# Patient Record
Sex: Female | Born: 1995 | Race: Black or African American | Hispanic: No | Smoking: Never smoker
Health system: Southern US, Community
[De-identification: ages and names within clinical notes are randomized; demographics above are authoritative.]

## PROBLEM LIST (undated history)

## (undated) DIAGNOSIS — L309 Dermatitis, unspecified: Secondary | ICD-10-CM

## (undated) DIAGNOSIS — D649 Anemia, unspecified: Secondary | ICD-10-CM

## (undated) DIAGNOSIS — Z9109 Other allergy status, other than to drugs and biological substances: Secondary | ICD-10-CM

## (undated) DIAGNOSIS — F419 Anxiety disorder, unspecified: Secondary | ICD-10-CM

## (undated) HISTORY — DX: Anemia, unspecified: D64.9

## (undated) HISTORY — DX: Other allergy status, other than to drugs and biological substances: Z91.09

## (undated) HISTORY — PX: WISDOM TOOTH EXTRACTION: SHX21

## (undated) HISTORY — DX: Anxiety disorder, unspecified: F41.9

## (undated) HISTORY — DX: Dermatitis, unspecified: L30.9

---
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
## 2018-02-04 ENCOUNTER — Ambulatory Visit: Payer: Self-pay | Admitting: Family Medicine

## 2018-02-04 ENCOUNTER — Encounter: Payer: Self-pay | Admitting: Family Medicine

## 2018-02-04 DIAGNOSIS — Z021 Encounter for pre-employment examination: Secondary | ICD-10-CM

## 2018-02-04 DIAGNOSIS — Z111 Encounter for screening for respiratory tuberculosis: Secondary | ICD-10-CM

## 2018-02-04 NOTE — Patient Instructions (Signed)

## 2018-02-04 NOTE — Progress Notes (Signed)
Karen May is a 22 y.o. 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 She denies any chronic or acute health conditions. She also needs TB test placement.  Review of Systems  Constitutional: Negative for chills, fever and malaise/fatigue.  HENT: Negative for congestion, ear discharge, ear pain, sinus pain and sore throat.   Eyes: Negative.   Respiratory: Negative for cough, sputum production and shortness of breath.   Cardiovascular: Negative.  Negative for chest pain.  Gastrointestinal: Negative for abdominal pain, diarrhea, nausea and vomiting.  Genitourinary: Negative for dysuria, frequency, hematuria and urgency.  Musculoskeletal: Negative for myalgias.  Skin: Negative.   Neurological: Negative for headaches.  Endo/Heme/Allergies: Negative.   Psychiatric/Behavioral: Negative.     O: Vitals:   02/04/18 1603  BP: 92/62  Pulse: 94  Temp: 98.9 F (37.2 C)  SpO2: 98%     Physical Exam  Constitutional: She is oriented to person, place, and time. Vital signs are normal. She appears well-developed and well-nourished. She is active.  Non-toxic appearance. She does not have a sickly appearance.  HENT:  Head: Normocephalic.  Right Ear: Hearing, tympanic membrane, external ear and ear canal normal.  Left Ear: Hearing, tympanic membrane, external ear and ear canal normal.  Nose: Nose normal.  Mouth/Throat: Uvula is midline and oropharynx is clear and moist.  Neck: Normal range of motion. Neck supple.  Cardiovascular: Normal rate, regular rhythm, normal heart sounds and normal pulses.  Pulmonary/Chest: Effort normal and breath sounds normal.  Abdominal: Soft. Bowel sounds are normal.  Musculoskeletal: Normal range of motion.  Lymphadenopathy:       Head (right side): No submental and no submandibular adenopathy present.       Head (left side): No submental and no submandibular adenopathy present.    She has no cervical  adenopathy.  Neurological: She is alert and oriented to person, place, and time.  Psychiatric: She has a normal mood and affect.  PHQ-9  Vitals reviewed.  A: 1. Screening examination for pulmonary tuberculosis   2. Physical exam, pre-employment     P: Discussed exam findings, diagnosis etiology and medication use and indications reviewed with patient. Follow- Up and discharge instructions provided. No emergent/urgent issues found on exam.  Patient verbalized understanding of information provided and agrees with plan of care (POC), all questions answered.  1. Physical exam, pre-employment WNL- no acute findings on interview or exam 2. Screening examination for pulmonary tuberculosis - PPD- placed 02/04/2018

## 2018-02-06 LAB — TB SKIN TEST
INDURATION: 0 mm
TB Skin Test: NEGATIVE

## 2018-04-10 ENCOUNTER — Other Ambulatory Visit: Payer: Self-pay

## 2018-04-10 ENCOUNTER — Encounter (HOSPITAL_BASED_OUTPATIENT_CLINIC_OR_DEPARTMENT_OTHER): Payer: Self-pay

## 2018-04-10 ENCOUNTER — Emergency Department (HOSPITAL_BASED_OUTPATIENT_CLINIC_OR_DEPARTMENT_OTHER)
Admission: EM | Admit: 2018-04-10 | Discharge: 2018-04-10 | Disposition: A | Discharge status: Home / Self Care | Payer: 59 | Attending: Emergency Medicine | Admitting: Emergency Medicine

## 2018-04-10 DIAGNOSIS — F17210 Nicotine dependence, cigarettes, uncomplicated: Secondary | ICD-10-CM

## 2018-04-10 DIAGNOSIS — N946 Dysmenorrhea, unspecified: Secondary | ICD-10-CM

## 2018-04-10 DIAGNOSIS — N3000 Acute cystitis without hematuria: Principal | ICD-10-CM

## 2018-04-10 DIAGNOSIS — R1084 Generalized abdominal pain: Secondary | ICD-10-CM | POA: Diagnosis present

## 2018-04-10 LAB — CBC WITH DIFFERENTIAL/PLATELET
ABS IMMATURE GRANULOCYTES: 0.01 10*3/uL (ref 0.00–0.07)
Basophils Absolute: 0 10*3/uL (ref 0.0–0.1)
Basophils Relative: 1 %
Eosinophils Absolute: 0.1 10*3/uL (ref 0.0–0.5)
Eosinophils Relative: 1 %
HCT: 33.8 % — ABNORMAL LOW (ref 36.0–46.0)
HEMOGLOBIN: 9.9 g/dL — AB (ref 12.0–15.0)
IMMATURE GRANULOCYTES: 0 %
LYMPHS PCT: 27 %
Lymphs Abs: 1.7 10*3/uL (ref 0.7–4.0)
MCH: 22.7 pg — AB (ref 26.0–34.0)
MCHC: 29.3 g/dL — ABNORMAL LOW (ref 30.0–36.0)
MCV: 77.5 fL — AB (ref 80.0–100.0)
MONO ABS: 0.7 10*3/uL (ref 0.1–1.0)
MONOS PCT: 11 %
NEUTROS ABS: 3.9 10*3/uL (ref 1.7–7.7)
NEUTROS PCT: 60 %
PLATELETS: 250 10*3/uL (ref 150–400)
RBC: 4.36 MIL/uL (ref 3.87–5.11)
RDW: 16.9 % — ABNORMAL HIGH (ref 11.5–15.5)
WBC: 6.5 10*3/uL (ref 4.0–10.5)
nRBC: 0 % (ref 0.0–0.2)

## 2018-04-10 LAB — COMPREHENSIVE METABOLIC PANEL
ALK PHOS: 37 U/L — AB (ref 38–126)
ALT: 9 U/L (ref 0–44)
AST: 14 U/L — AB (ref 15–41)
Albumin: 3.6 g/dL (ref 3.5–5.0)
Anion gap: 10 (ref 5–15)
BUN: 10 mg/dL (ref 6–20)
CALCIUM: 8.4 mg/dL — AB (ref 8.9–10.3)
CHLORIDE: 106 mmol/L (ref 98–111)
CO2: 23 mmol/L (ref 22–32)
CREATININE: 0.74 mg/dL (ref 0.44–1.00)
Glucose, Bld: 80 mg/dL (ref 70–99)
Potassium: 3.4 mmol/L — ABNORMAL LOW (ref 3.5–5.1)
SODIUM: 139 mmol/L (ref 135–145)
Total Bilirubin: 1.1 mg/dL (ref 0.3–1.2)
Total Protein: 6.7 g/dL (ref 6.5–8.1)

## 2018-04-10 LAB — URINALYSIS, MICROSCOPIC (REFLEX)

## 2018-04-10 LAB — URINALYSIS, ROUTINE W REFLEX MICROSCOPIC
BILIRUBIN URINE: NEGATIVE
GLUCOSE, UA: NEGATIVE mg/dL
HGB URINE DIPSTICK: NEGATIVE
Ketones, ur: 15 mg/dL — AB
Nitrite: NEGATIVE
Protein, ur: NEGATIVE mg/dL
SPECIFIC GRAVITY, URINE: 1.02 (ref 1.005–1.030)
pH: 6.5 (ref 5.0–8.0)

## 2018-04-10 LAB — PREGNANCY, URINE: Preg Test, Ur: NEGATIVE

## 2018-04-10 LAB — LIPASE, BLOOD: Lipase: 22 U/L (ref 11–51)

## 2018-04-10 MED ORDER — NITROFURANTOIN MONOHYD MACRO 100 MG PO CAPS: 100.0000 mg | ORAL_CAPSULE | Freq: Two times a day (BID) | ORAL | 0 refills | Status: DC

## 2018-04-10 NOTE — ED Notes (Signed)
Pt up to BR to obtain urine specimen

## 2018-04-10 NOTE — ED Provider Notes (Signed)
[facility] EMERGENCY DEPARTMENT Provider Note   CSN: [id number] Arrival date & time: 04/10/18  1143   History   Chief Complaint Chief Complaint  Patient presents with  . Dysmenorrhea    HPI Karen May is a 21 y.o. female with no significant history who presents for evaluation of abdominal pain.  She woke this morning with generalized abdominal pain, worse in the upper quadrants.  Rates her pain 2/10 currently, 9/10 at its worse.  Describes her pain as stabbing and cramping.  Denies history of abdominal surgeries.  Last menstrual period 04/09/18.  Denies fever, chills, nausea, vomiting, diarrhea, constipation, dysuria, vaginal discharge.  Patient states she does have a history of dysmenorrhea.  States she normally takes ibuprofen which resolves her symptoms.  Patient states that when she first took ibuprofen this morning it "did not work."  Patient states her pain does not get worse with food intake.  Denies aggravating or alleviating factors.  Denies sick contacts.  History obtained from patient.  No interpreter was used.  HPI  History reviewed. No pertinent past medical history.  There are no active problems to display for this patient.   History reviewed. No pertinent surgical history.   OB History   None      Home Medications    Prior to Admission medications   Medication Sig Start Date End Date Taking? Authorizing Provider  nitrofurantoin, macrocrystal-monohydrate, (MACROBID) 100 MG capsule Take 1 capsule (100 mg total) by mouth 2 (two) times daily. 04/10/18   ,  A, PA-C    Family History No family history on file.  Social History Social History   Tobacco Use  . Smoking status: Current Every Day Smoker    Types: Cigarettes  . Smokeless tobacco: Never Used  Substance Use Topics  . Alcohol use: Yes    Comment: occ  . Drug use: Yes    Types: Marijuana     Allergies   Patient has no known allergies.   Review of Systems Review  of Systems  Constitutional: Negative.   Respiratory: Negative.   Cardiovascular: Negative.   Gastrointestinal: Positive for abdominal pain. Negative for abdominal distention, anal bleeding, blood in stool, constipation, diarrhea, nausea, rectal pain and vomiting.  Genitourinary: Negative.   Musculoskeletal: Negative.   Neurological: Negative.   All other systems reviewed and are negative.    Physical Exam Updated Vital Signs BP 117/66 (BP Location: Right Arm)   Pulse (!) 59   Temp 98.3 F (36.8 C) (Oral)   Resp 16   Ht 5\' 5"  (1.651 m)   Wt 63.5 kg   LMP 04/09/2018   SpO2 100%   BMI 23.30 kg/m   Physical Exam  Constitutional: Vital signs are normal. She appears well-developed and well-nourished.  Non-toxic appearance. She does not have a sickly appearance. She does not appear ill. No distress.  HENT:  Head: Atraumatic.  Mouth/Throat: Oropharynx is clear and moist.  Eyes: Pupils are equal, round, and reactive to light.  Neck: Normal range of motion.  Cardiovascular: Normal rate, regular rhythm, normal heart sounds and intact distal pulses. Exam reveals no friction rub.  No murmur heard. Pulmonary/Chest: Effort normal and breath sounds normal. No stridor. No respiratory distress. She has no wheezes. She has no rales. She exhibits no tenderness.  Abdominal: Soft. Normal appearance and bowel sounds are normal. She exhibits no distension, no pulsatile liver, no fluid wave, no ascites, no pulsatile midline mass and no mass. There is no hepatosplenomegaly. There is no tenderness.  There is no rigidity, no rebound, no guarding, no CVA tenderness and negative Murphy's sign.  Musculoskeletal: Normal range of motion.  Neurological: She is alert.  Skin: Skin is warm and dry. She is not diaphoretic.  Psychiatric: She has a normal mood and affect.  Nursing note and vitals reviewed.    ED Treatments / Results  Labs (all labs ordered are listed, but only abnormal results are  displayed) Labs Reviewed  CBC WITH DIFFERENTIAL/PLATELET - Abnormal; Notable for the following components:      Result Value   Hemoglobin 9.9 (*)    HCT 33.8 (*)    MCV 77.5 (*)    MCH 22.7 (*)    MCHC 29.3 (*)    RDW 16.9 (*)    All other components within normal limits  COMPREHENSIVE METABOLIC PANEL - Abnormal; Notable for the following components:   Potassium 3.4 (*)    Calcium 8.4 (*)    AST 14 (*)    Alkaline Phosphatase 37 (*)    All other components within normal limits  URINALYSIS, ROUTINE W REFLEX MICROSCOPIC - Abnormal; Notable for the following components:   Ketones, ur 15 (*)    Leukocytes, UA MODERATE (*)    All other components within normal limits  URINALYSIS, MICROSCOPIC (REFLEX) - Abnormal; Notable for the following components:   Bacteria, UA MANY (*)    All other components within normal limits  LIPASE, BLOOD  PREGNANCY, URINE    EKG None  Radiology No results found.  Procedures Procedures (including critical care time)  Medications Ordered in ED Medications - No data to display   Initial Impression / Assessment and Plan / ED Course  I have reviewed the triage vital signs and the nursing notes.  Pertinent labs & imaging results that were available during my care of the patient were reviewed by me and considered in my medical decision making (see chart for details).  22 year old who appears otherwise well presents for evaluation of generalized abdominal pain, worse in upper quadrants.  Afebrile, nonseptic, non-ill-appearing.  Abdomen without tenderness, rebound or guarding on exam.  No abdominal pain on initial exam.  Negative Murphy sign.  Will obtain labs, urine and reevaluate.  Labs without leukocytosis, mild hypokalemia 3.4, hemoglobin 9.9, patient states she has chronic anemia and is supposed to be taking iron, however is not taking this.  Lipase 22, urine pregnancy negative, urinalysis consistent with infection.  Patient without abdominal  tenderness on reevaluation.  No rebound or guarding.  I do not feel patient needs additional imaging at this time as she is currently asymptomatic.  Will DC home with antibiotics for UTI.  Patient is nontoxic, nonseptic appearing, in no apparent distress. Patient does not meet the SIRS or Sepsis criteria.  On repeat exam patient does not have a surgical abdomin and there are no peritoneal signs.  No indication of appendicitis, bowel obstruction, bowel perforation, cholecystitis, diverticulitis, PID or ectopic pregnancy.  Patient discharged home with symptomatic treatment and given strict instructions for follow-up with their primary care physician.  I have also discussed reasons to return immediately to the ER.  Patient expresses understanding and agrees with plan.    Final Clinical Impressions(s) / ED Diagnoses   Final diagnoses:  Acute cystitis without hematuria    ED Discharge Orders         Ordered    nitrofurantoin, macrocrystal-monohydrate, (MACROBID) 100 MG capsule  2 times daily     04/10/18 1528  Linwood DibblesHenderly,  A, PA-C 04/10/18 1534    Tilden Fossaees, Elizabeth, MD 04/11/18 [phone]

## 2018-04-10 NOTE — ED Triage Notes (Addendum)
C/o painful menstrual cramps-LMP 11/12-NAD-steady gait-took motrin PTA with some relief

## 2018-04-10 NOTE — Discharge Instructions (Addendum)
Evaluated today for abdominal pain. You urinalysis showed you have a UTI.  I have prescribed you antibiotics.  Please take as prescribed.  Please follow-up with your PCP for reevaluation.  Return to the ED for any worsening symptoms

## 2018-04-10 NOTE — ED Notes (Signed)
NAD at this time. Pt is stable and going home.  

## 2018-09-25 ENCOUNTER — Other Ambulatory Visit: Payer: Self-pay

## 2018-09-25 ENCOUNTER — Other Ambulatory Visit (HOSPITAL_COMMUNITY)
Admission: RE | Admit: 2018-09-25 | Discharge: 2018-09-25 | Disposition: A | Discharge status: Home / Self Care | Payer: 59 | Source: Ambulatory Visit | Attending: Certified Nurse Midwife | Admitting: Certified Nurse Midwife

## 2018-09-25 ENCOUNTER — Ambulatory Visit (INDEPENDENT_AMBULATORY_CARE_PROVIDER_SITE_OTHER): Payer: 59 | Admitting: Certified Nurse Midwife

## 2018-09-25 ENCOUNTER — Encounter: Payer: Self-pay | Admitting: Certified Nurse Midwife

## 2018-09-25 VITALS — BP 90/50 | Ht 65.0 in | Wt 146.0 lb

## 2018-09-25 DIAGNOSIS — Z3400 Encounter for supervision of normal first pregnancy, unspecified trimester: Principal | ICD-10-CM

## 2018-09-25 DIAGNOSIS — Z1379 Encounter for other screening for genetic and chromosomal anomalies: Secondary | ICD-10-CM

## 2018-09-25 DIAGNOSIS — O99321 Drug use complicating pregnancy, first trimester: Secondary | ICD-10-CM

## 2018-09-25 DIAGNOSIS — Z3A01 Less than 8 weeks gestation of pregnancy: Secondary | ICD-10-CM

## 2018-09-25 DIAGNOSIS — Z113 Encounter for screening for infections with a predominantly sexual mode of transmission: Secondary | ICD-10-CM

## 2018-09-25 DIAGNOSIS — F1290 Cannabis use, unspecified, uncomplicated: Secondary | ICD-10-CM

## 2018-09-25 DIAGNOSIS — Z124 Encounter for screening for malignant neoplasm of cervix: Secondary | ICD-10-CM

## 2018-09-25 NOTE — Progress Notes (Signed)
New Obstetric Patient H&P    Chief Complaint: "Desires prenatal care"   History of Present Illness: Patient is a 23 y.o. G1P0 Black female, LMP 08/08/2018 presents with amenorrhea and positive home pregnancy test. Based on her  LMP, her EDD is Estimated Date of Delivery: 05/15/19 and her EGA is [redacted]w[redacted]d. Cycles are 5-7. days, regular, and occur approximately every : 28 days. Her last pap smear was 1 or 2 years ago and was no abnormalities.    She had a urine pregnancy test which was positive 9 day(s)  Ago (09/16/2018). Her last menstrual period was normal and lasted for  5-7 day(s). Since her LMP she claims she has experienced nausea and vomiting and breast tenderness. She denies vaginal bleeding, abdominal pain, dysuria. Her past medical history is remarkable for environmental allergies and eczema.  Since her LMP, she admits to the use of tobacco products  No Since her LMP she admits to use of alcohol until +UPT (4/20) Since her LMP, she admits to the use of marijuana (1-2/week) She claims she has gained  8 pounds since the start of her pregnancy.  There are cats in the home in the home  no If yes NA She admits close contact with children on a regular basis  no  She has had chicken pox in the past no She has had Tuberculosis exposures, symptoms, or previously tested positive for TB   no Current or past history of domestic violence. no  Genetic Screening/Teratology Counseling: (Includes patient, baby's father, or anyone in either family with:)   1. Patient's age >/= 61 [facility]  no 2. Thalassemia ([place], [place], Mediterranean, or Asian background): MCV<80  no 3. Neural tube defect (meningomyelocele, spina bifida, anencephaly)  no 4. Congenital heart defect  no  5. Down syndrome  no 6. Tay-Sachs (Jewish, [place]  no 7. Canavan's Disease  no 8. Sickle cell disease or trait (African)  no  9. Hemophilia or other blood disorders  no  10. Muscular dystrophy  no  11. Cystic  fibrosis  no  12. Huntington's Chorea  no  13. Mental retardation/autism  no 14. Other inherited genetic or chromosomal disorder  no 15. Maternal metabolic disorder (DM, PKU, etc)  no 16. Patient or FOB with a child with a birth defect not listed above no  16a. Patient or FOB with a birth defect themselves no 17. Recurrent pregnancy loss, or stillbirth  no  18. Any medications since LMP other than prenatal vitamins (include vitamins, supplements, OTC meds, drugs, alcohol)  Yes. Marijuana, alcohol until +UPT 19. Any other genetic/environmental exposure to discuss  no  Infection History:   1. Lives with someone with TB or TB exposed  no  2. Patient or partner has history of genital herpes  no 3. Rash or viral illness since LMP  no 4. History of STI (GC, CT, HPV, syphilis, HIV)  no 5. History of recent travel :  no  Other pertinent information:  Yes, FOB/partner is [place] age 31, Engineer, maintenance (IT), and will be involved with her care    Review of Systems:10 point review of systems negative unless otherwise noted in HPI  Past Medical History:  Past Medical History:  Diagnosis Date  . Anemia   . Eczema 2010  . Environmental allergies     Past Surgical History:  Past Surgical History:  Procedure Laterality Date  . WISDOM TOOTH EXTRACTION      Gynecologic History: Patient's last  menstrual period was 08/08/2018 (exact date).  Obstetric History: G1P0  Family History:  Family History  Problem Relation Age of Onset  . Lung cancer Maternal Grandmother   . Diabetes Paternal Grandmother   . Breast cancer Maternal Aunt 61       maternal great aunt  . Cancer Cousin   . Breast cancer Cousin 27  . Schizophrenia Maternal Aunt   . Diabetes Cousin     Social History:  Social History   Socioeconomic History  . Marital status: Significant Other    Spouse name: 9  . Number of children: 0  . Years of education: 16  . Highest education level: Bachelor's degree (e.g., BA,  AB, BS)  Occupational History  . Occupation: Rehab specialist  Social Needs  . Financial resource strain: Not on file  . Food insecurity:    Worry: Not on file    Inability: Not on file  . Transportation needs:    Medical: Not on file    Non-medical: Not on file  Tobacco Use  . Smoking status: Never Smoker  . Smokeless tobacco: Never Used  Substance and Sexual Activity  . Alcohol use: Not Currently    Comment: occ  . Drug use: Yes    Types: Marijuana  . Sexual activity: Yes    Partners: Male    Birth control/protection: None  Lifestyle  . Physical activity:    Days per week: Not on file    Minutes per session: Not on file  . Stress: Not on file  Relationships  . Social connections:    Talks on phone: Not on file    Gets together: Not on file    Attends religious service: Not on file    Active member of club or organization: Not on file    Attends meetings of clubs or organizations: Not on file    Relationship status: Not on file  . Intimate partner violence:    Fear of current or ex partner: Not on file    Emotionally abused: Not on file    Physically abused: Not on file    Forced sexual activity: Not on file  Other Topics Concern  . Not on file  Social History Narrative  . Not on file    Allergies:  No Known Allergies  Medications: none Physical Exam Vitals: BP (!) 90/50   Wt 146 lb (66.2 kg)   LMP 08/08/2018 (Exact Date)   BMI 24.30 kg/m   General: BF in NAD HEENT: normocephalic, anicteric, PEARL  Mouth: normal dentition Thyroid: no enlargement, no palpable nodules Breasts: No masses, inflammation, soft, tender. Pierced nipples Pulmonary: No increased work of breathing, CTAB Cardiovascular: RRR, without murmur Abdomen: soft, non-tender, non-distended.  Umbilicus without lesions.  No hepatomegaly or masses palpable. No evidence of hernia. Pierced umbilicus Genitourinary:  External: Normal external female genitalia.  Normal urethral meatus, normal  Bartholin's and Skene's glands.    Vagina: Normal vaginal mucosa, no evidence of prolapse.    Cervix: Grossly normal in appearance, no bleeding  Uterus: AF, globular, 6-8 week size, mobile, normal contour.    Adnexa: ovaries non-enlarged, no adnexal masses  Rectal: deferred Extremities: no edema, erythema, or tenderness Neurologic: Grossly intact Psychiatric: mood appropriate, affect full   Assessment: 23 y.o. G1P0 at [redacted]w[redacted]d presenting to initiate prenatal care S=D Marijuana use in early pregnancy/ Alcohol use in first 2 weeks of pregnancy    Plan: 1) Avoid alcoholic beverages. 2) Patient encouraged not to smoke marijuana. Recommend trying Unisom  and vitamin B6 for nausea of pregnancy. If that is not effective, patient to contact me for prescription for antiemetic. Also encouraged to eat small frequent meals/snacks.    3) Take prenatal vitamins daily. GIven samples to try. 4) Nutrition, food safety (fish, cheese advisories, and high nitrite foods) and exercise discussed. Handouts given 5) Hospital and practice style discussed with cross coverage system.  6) Genetic Screening, such as with 1st Trimester Screening, cell free fetal DNA, AFP testing, and Ultrasound, as well as with amniocentesis, is discussed with patient. At the conclusion of today's visit patient requested genetic testing. Will schedule for first trimester test in 5-6 weeks. ROB in 3-4 weeks for DT check 7) Patient is asked about travel to areas at risk for the Zika virus, and counseled to avoid travel and exposure to mosquitoes or sexual partners who may have themselves been exposed to the virus. Testing is discussed, and will be ordered as appropriate.  Recommend taking precautions to decrease risk of Covid 19 infection (mask, wash hands frequently, etc) 8) NOB labs today, Paptima done 9) Is interested in breast feeding. Recommend removing nipple piercings.  Farrel Connersolleen Karell Tukes, CNM    NOB- no concerns

## 2018-09-25 NOTE — Progress Notes (Signed)
NOB H&P/ NOB labs Desires first trimester test

## 2018-09-25 NOTE — Patient Instructions (Signed)
 Hello,  Given the current COVID-19 pandemic, our practice is making changes in how we are providing care to our patients. We are limiting in-person visits for the safety of all of our patients.   As a practice, we have met to discuss the best way to minimize visits, but still provide excellent care to our expecting mothers.  We have decided on the following visit structure for low-risk pregnancies.  Initial Pregnancy visit will be conducted as a telephone or web visit.  Between 10-14 weeks  there will be one in-person visit for an ultrasound, lab work, and genetic screening. 20 weeks in-person visit with an anatomy ultrasound  28 weeks in-person office visit for a 1-hour glucose test and a TDAP vaccination 32 weeks in-person office visit 34 weeks telephone visit 36 weeks in-person office visit for GBS, chlamydia, and gonorrhea testing 38 weeks in-person office visit 40 weeks in-person office visit  Understandably, some patients will require more visits than what is outlined above. Additional visits will be determined on a case-by-case basis.   We will, as always, be available for emergencies or to address concerns that might arise between in-person visits. We ask that you allow us the opportunity to address any concerns over the phone or through a virtual visit first. We will be available to return your phone calls throughout the day.   If you are able to purchase a scale, a blood pressure machine, and a home fetal doppler visits could be limited further. This will help decrease your exposure risks, but these purchases are not a necessity.   Things seem to change daily and there is the possibility that this structure could change, please be patient as we adapt to a new way of caring for patients.   Thank you for trusting us with your prenatal care. Our practice values you and looks forward to providing you with excellent care.   Sincerely,   [facility], [facility]     COVID-19 and Your Pregnancy FAQ  How can I prevent infection with COVID-19 during my pregnancy? Social distancing is key. Please limit any interactions in public. Try and work from home if possible. Frequently wash your hands after touching possibly contaminated surfaces. Avoid touching your face.  Minimize trips to the store. Consider online ordering when possible.   Should I wear a mask? YES. It is recommended by the CDC that all people wear a cloth mask or facial covering in public. This will help reduce transmission as well as your risk or acquiring COVID-19. New studies are showing that even asymptomatic individuals can spread the virus from talking.   What are the symptoms of COVID-19? Fever (greater than 100.4 F), dry cough, shortness of breath.  Am I more at risk for COVID-19 since I am pregnant? There is not currently data showing that pregnant women are more adversely impacted by COVID-19 than the general population. However, we know that pregnant women tend to have worse respiratory complications from similar diseases such as the flu and SARS and for this reason should be considered an at-risk population.  What do I do if I am experiencing the symptoms of COVID-19? Testing is being limited because of test availability. If you are experiencing symptoms you should quarantine yourself, and the members of your family, for at least 2 weeks at home.   Please visit this website for more information: https://www.cdc.gov/coronavirus/2019-ncov/if-you-are-sick/steps-when-sick.html  When should I go to the Emergency Room? Please go to the emergency room if you   are experiencing ANY of these symptoms*:  1.    Difficulty breathing or shortness of breath 2.    Persistent pain or pressure in the chest 3.    Confusion or difficulty being aroused (or awakened) 4.    Bluish lips or face  *This list is not all inclusive. Please consult our office for any other symptoms that are severe or  concerning.  What do I do if I am having difficulty breathing? You should go to the Emergency Room for evaluation. At this time they have a tent set up for evaluating patients with COVID-19 symptoms.   How will my prenatal care be different because of the COVID-19 pandemic? It has been recommended to reduce the frequency of face-to-face visits and use resources such as telephone and virtual visits when possible. Using a scale, blood pressure machine and fetal doppler at home can further help reduce face-to-face visits. You will be provided with additional information on this topic.  We ask that you come to your visits alone to minimize potential exposures to  COVID-19.  How can I receive childbirth education? At this time in-person classes have been cancelled. You can register for online childbirth education, breastfeeding, and newborn care classes.  Please visit:  [email or web address] for more information  How will my hospital birth experience be different? The hospital is currently limiting visitors. This means that while you are in labor you can only have one person at the hospital with you. Additional family members will not be allowed to wait in the building or outside your room. Your one support person can be the father of the baby, a relative, a doula, or a friend. Once one support person is designated that person will wear a band. This band cannot be shared with multiple people.  Nitrous Gas is not being offered for pain relief since the tubing and filter for the machine can not be sanitized in a way to guarantee prevention of transmission of COVID-19.  Nasal cannula use of oxygen for fetal indications has also been discontinued.  Currently a clear plastic sheet is being hung between mom and the delivering provider during pushing and delivery to help prevent transmission of COVID-19.      How long will I stay in the hospital for after giving birth? It is also recommended that  discharge home be expedited during the COVID-19 outbreak. This means staying for 1 day after a vaginal delivery and 2 days after a cesarean section. Patients who need to stay longer for medical reasons are allowed to do so, but the goal will be for expedited discharge home.   What if I have COVID-19 and I am in labor? We ask that you wear a mask while on labor and delivery. We will try and accommodate you being placed in a room that is capable of filtering the air. Please call ahead if you are in labor and on your way to the hospital. The phone number for labor and delivery at [facility] is [phone].  If I have COVID-19 when my baby is born how can I prevent my baby from contracting COVID-19? This is an issue that will have to be discussed on a case-by-case basis. Current recommendations suggest providing separate isolation rooms for both the mother and new infant as well as limiting visitors. However, there are practical challenges to this recommendation. The situation will assuredly change and decisions will be influenced by the desires of the mother and availability of space.    Some suggestions are the use of a curtain or physical barrier between mom and infant, hand hygiene, mom wearing a mask, or 6 feet of spacing between a mom and infant.   Can I breastfeed during the COVID-19 pandemic?   Yes, breastfeeding is encouraged.  Can I breastfeed if I have COVID-19? Yes. Covid-19 has not been found in breast milk. This means you cannot give COVID-19 to your child through breast milk. Breast feeding will also help pass antibodies to fight infection to your baby.   What precautions should I take when breastfeeding if I have COVID-19? If a mother and newborn do room-in and the mother wishes to feed at the breast, she should put on a facemask and practice hand hygiene before each feeding.  What precautions should I take when pumping if I have COVID-19? Prior to expressing  breast milk, mothers should practice hand hygiene. After each pumping session, all parts that come into contact with breast milk should be thoroughly washed and the entire pump should be appropriately disinfected per the manufacturer's instructions. This expressed breast milk should be fed to the newborn by a healthy caregiver.  What if I am pregnant and work in healthcare? Based on limited data regarding COVID-19 and pregnancy, ACOG currently does not propose creating additional restrictions on pregnant health care personnel because of COVID-19 alone. Pregnant women do not appear to be at higher risk of severe disease related to COVID-19. Pregnant health care personnel should follow CDC risk assessment and infection control guidelines for health care personnel exposed to patients with suspected or confirmed COVID-19. Adherence to recommended infection prevention and control practices is an important part of protecting all health care personnel in health care settings.    Information on COVID-19 in pregnancy is very limited; however, facilities may want to consider limiting exposure of pregnant health care personnel to patients with confirmed or suspected COVID-19 infection, especially during higher-risk procedures (eg, aerosol-generating procedures), if feasible, based on staffing availability.    

## 2018-09-27 LAB — CYTOLOGY - PAP
Chlamydia: NEGATIVE
Diagnosis: NEGATIVE
Neisseria Gonorrhea: NEGATIVE

## 2018-09-27 LAB — RPR+RH+ABO+RUB AB+AB SCR+CB...
Antibody Screen: NEGATIVE
HIV Screen 4th Generation wRfx: NONREACTIVE
Hematocrit: 40.7 % (ref 34.0–46.6)
Hemoglobin: 12.3 g/dL (ref 11.1–15.9)
Hepatitis B Surface Ag: NEGATIVE
MCH: 22.7 pg — ABNORMAL LOW (ref 26.6–33.0)
MCHC: 30.2 g/dL — ABNORMAL LOW (ref 31.5–35.7)
MCV: 75 fL — ABNORMAL LOW (ref 79–97)
Platelets: 274 10*3/uL (ref 150–450)
RBC: 5.42 x10E6/uL — ABNORMAL HIGH (ref 3.77–5.28)
RDW: 18.5 % — ABNORMAL HIGH (ref 11.7–15.4)
RPR Ser Ql: NONREACTIVE
Rh Factor: POSITIVE
Rubella Antibodies, IGG: 3.56 index (ref >0.99)
Varicella zoster IgG: >4000 index (ref >165)
WBC: 5.8 10*3/uL (ref 3.4–10.8)

## 2018-09-27 LAB — HEMOGLOBINOPATHY EVALUATION
HGB C: 0 %
HGB S: 0 %
HGB VARIANT: 0 %
Hemoglobin A2 Quantitation: 1.8 % (ref 1.8–3.2)
Hemoglobin F Quantitation: 0 % (ref 0.0–2.0)
Hgb A: 98.2 % (ref 96.4–98.8)

## 2018-09-27 LAB — URINE CULTURE

## 2018-09-28 LAB — URINE DRUG PANEL 7
Amphetamines, Urine: NEGATIVE ng/mL
Barbiturate Quant, Ur: NEGATIVE ng/mL
Benzodiazepine Quant, Ur: NEGATIVE ng/mL
Cannabinoid Quant, Ur: POSITIVE — AB
Cocaine (Metab.): NEGATIVE ng/mL
Opiate Quant, Ur: NEGATIVE ng/mL
PCP Quant, Ur: NEGATIVE ng/mL

## 2018-10-23 ENCOUNTER — Ambulatory Visit (INDEPENDENT_AMBULATORY_CARE_PROVIDER_SITE_OTHER): Payer: 59

## 2018-10-23 ENCOUNTER — Ambulatory Visit (INDEPENDENT_AMBULATORY_CARE_PROVIDER_SITE_OTHER): Payer: 59 | Admitting: Certified Nurse Midwife

## 2018-10-23 ENCOUNTER — Other Ambulatory Visit: Payer: Self-pay

## 2018-10-23 VITALS — BP 100/40 | Wt 143.0 lb

## 2018-10-23 DIAGNOSIS — Z3A10 10 weeks gestation of pregnancy: Secondary | ICD-10-CM

## 2018-10-23 DIAGNOSIS — O26851 Spotting complicating pregnancy, first trimester: Secondary | ICD-10-CM

## 2018-10-23 DIAGNOSIS — O3680X0 Pregnancy with inconclusive fetal viability, not applicable or unspecified: Secondary | ICD-10-CM

## 2018-10-23 NOTE — Progress Notes (Signed)
ROB No concerns  Would like genetic testing

## 2018-10-23 NOTE — Progress Notes (Signed)
ROB at 10 wk 6 days by LMP +/- 3 days: Reports having some blood on toilet paper when she wiped this AM. No blood in toilet. Not cramping, but having some weird feelings in lower abdomen. Unable to hear FHTs with DT. Pelvic exam: External/BUS: no inflammation or lesions Vagina: brown tinted discharge Cervix: appears closed, no lesions or active bleeding Ultrasound: Empty gestational sac (elongated in shape)/ amnion noted, no fetal pole Probable failed pregnancy (anembryonic) Discussed care with Dr Bonney Aid: will get serial beta HCGS and a progesterone level today just to make sure not an earlier pregnancy DIscussed likely non viable pregnancy with patient who is tearful.  SAB precautions given O POS blood type Will call with lab results Follow up appointment in 6 days  Farrel Conners, [place]

## 2018-10-24 ENCOUNTER — Telehealth: Payer: Self-pay | Admitting: Obstetrics and Gynecology

## 2018-10-24 LAB — BETA HCG QUANT (REF LAB): hCG Quant: 5810 m[IU]/mL

## 2018-10-24 LAB — PROGESTERONE: Progesterone: 5.5 ng/mL

## 2018-10-24 NOTE — Telephone Encounter (Signed)
Unable to reach patient, voicemail box is full unable to leave message. Per CLG Patient needs ultrasound appointment with Next weeks appointment before she sees Dr. Jean Rosenthal. Sending Mychart message

## 2018-10-29 ENCOUNTER — Other Ambulatory Visit: Payer: Self-pay | Admitting: Certified Nurse Midwife

## 2018-10-29 ENCOUNTER — Other Ambulatory Visit: Payer: 59

## 2018-10-29 ENCOUNTER — Other Ambulatory Visit: Payer: Self-pay | Admitting: Obstetrics and Gynecology

## 2018-10-29 ENCOUNTER — Other Ambulatory Visit: Payer: Self-pay

## 2018-10-29 ENCOUNTER — Ambulatory Visit: Payer: 59

## 2018-10-29 DIAGNOSIS — O3680X0 Pregnancy with inconclusive fetal viability, not applicable or unspecified: Secondary | ICD-10-CM

## 2018-10-29 DIAGNOSIS — O034 Incomplete spontaneous abortion without complication: Secondary | ICD-10-CM

## 2018-10-29 DIAGNOSIS — O26851 Spotting complicating pregnancy, first trimester: Secondary | ICD-10-CM

## 2018-10-30 ENCOUNTER — Encounter: Payer: Self-pay | Admitting: Obstetrics and Gynecology

## 2018-10-30 ENCOUNTER — Other Ambulatory Visit (INDEPENDENT_AMBULATORY_CARE_PROVIDER_SITE_OTHER): Payer: 59

## 2018-10-30 ENCOUNTER — Ambulatory Visit: Payer: 59

## 2018-10-30 ENCOUNTER — Other Ambulatory Visit: Payer: Self-pay | Admitting: Certified Nurse Midwife

## 2018-10-30 ENCOUNTER — Ambulatory Visit (INDEPENDENT_AMBULATORY_CARE_PROVIDER_SITE_OTHER): Payer: 59 | Admitting: Obstetrics and Gynecology

## 2018-10-30 ENCOUNTER — Other Ambulatory Visit: Payer: Self-pay

## 2018-10-30 ENCOUNTER — Other Ambulatory Visit: Payer: Self-pay | Admitting: Obstetrics and Gynecology

## 2018-10-30 ENCOUNTER — Ambulatory Visit: Payer: 59 | Admitting: Obstetrics and Gynecology

## 2018-10-30 VITALS — BP 122/74 | Wt 139.0 lb

## 2018-10-30 DIAGNOSIS — O200 Threatened abortion: Secondary | ICD-10-CM

## 2018-10-30 DIAGNOSIS — O039 Complete or unspecified spontaneous abortion without complication: Secondary | ICD-10-CM | POA: Diagnosis not present

## 2018-10-30 LAB — BETA HCG QUANT (REF LAB): hCG Quant: 653 m[IU]/mL

## 2018-10-30 NOTE — Progress Notes (Signed)
Gynecology Ultrasound Follow Up   Chief Complaint  Patient presents with  . Follow-up  Anembryonic pregnancy   History of Present Illness: Patient is a 23 y.o. female who presents today for ultrasound evaluation of the above .  Ultrasound demonstrates the following findings Adnexa: no masses seen  Uterus: anteverted with endometrial stripe  14 mm at thickest and 3 mm at thinnest.  Additional: No intrauterine gestational sac noted.  A hematoma versus products of conception noted in the lower uterine segment (this is where the endometrial stripe is the thickest).   The patient notes heavy bleeding last Wednesday, which began to lighten Thursday and Friday.  She continues to have mild bleeding and cramping, which is nothing compared to a week ago.   Past Medical History:  Diagnosis Date  . Anemia   . Eczema 2010  . Environmental allergies     Past Surgical History:  Procedure Laterality Date  . WISDOM TOOTH EXTRACTION      Family History  Problem Relation Age of Onset  . Lung cancer Maternal Grandmother   . Diabetes Paternal Grandmother   . Breast cancer Maternal Aunt 2056       maternal great aunt  . Cancer Cousin   . Breast cancer Cousin 27  . Schizophrenia Maternal Aunt   . Diabetes Cousin     Social History   Socioeconomic History  . Marital status: Significant Other    Spouse name: 46Devon  . Number of children: 0  . Years of education: 16  . Highest education level: Bachelor's degree (e.g., BA, AB, BS)  Occupational History  . Occupation: Rehab specialist  Social Needs  . Financial resource strain: Not on file  . Food insecurity:    Worry: Not on file    Inability: Not on file  . Transportation needs:    Medical: Not on file    Non-medical: Not on file  Tobacco Use  . Smoking status: Never Smoker  . Smokeless tobacco: Never Used  Substance and Sexual Activity  . Alcohol use: Not Currently    Comment: occ  . Drug use: Yes    Types: Marijuana  .  Sexual activity: Yes    Partners: Male    Birth control/protection: None  Lifestyle  . Physical activity:    Days per week: Not on file    Minutes per session: Not on file  . Stress: Not on file  Relationships  . Social connections:    Talks on phone: Not on file    Gets together: Not on file    Attends religious service: Not on file    Active member of club or organization: Not on file    Attends meetings of clubs or organizations: Not on file    Relationship status: Not on file  . Intimate partner violence:    Fear of current or ex partner: Not on file    Emotionally abused: Not on file    Physically abused: Not on file    Forced sexual activity: Not on file  Other Topics Concern  . Not on file  Social History Narrative  . Not on file    No Known Allergies  Prior to Admission medications   Medication Sig Start Date End Date Taking? Authorizing Provider  Prenat-Fe Carbonyl-FA-Omega 3 (ONE-A-DAY WOMENS PRENATAL 1) 28-0.8-235 MG CAPS Take by mouth.    [provider]    Physical Exam BP 122/74   Wt 139 lb (63 kg)   LMP 08/08/2018 (  Exact Date)   BMI 23.13 kg/m    General: NAD HEENT: normocephalic, anicteric Pulmonary: No increased work of breathing Extremities: no edema, erythema, or tenderness Neurologic: Grossly intact, normal gait Psychiatric: mood appropriate, affect full  Labs:  Quantitative beta hCG 10/23/2018: 5,810 10/29/2018: 653  Imaging Results US Ob Transvaginal  Result Date: 10/30/2018 Patient Name: Kosisochukwu Sandford DOB: 06-08-1995 MRN: [id number] ULTRASOUND REPORT Location: [facility] Date of Service: 10/30/2018 Indications: Follow up empty gestational sac Findings: No intrauterine pregnancy seen. Endometrium appears heterogeneous and thickened measuring 14.73mm. Possible hematoma versus retained products of conception. Thickened area moving down towards lower uterine segment during exam. Right Ovary is normal in appearance. Left Ovary is  normal appearance. Survey of the adnexa demonstrates no adnexal masses. There is no free peritoneal fluid in the cul de sac. Impression: 1. No intrauterine pregnancy seen. 2. Retained products versus hematoma Darlina Guys, RT The ultrasound images and findings were reviewed by me and I agree with the above report. Thomasene Mohair, MD, [facility], [facility] 10/30/2018 5:03 PM       Assessment: 23 y.o. G1P0  1. Spontaneous abortion      Plan: Problem List Items Addressed This Visit    None    Visit Diagnoses    Spontaneous abortion    -  Primary     Discussed ultrasound findings in detail.  It appears she has nearly passed the products of conception.  We did discuss the possibility that some products of conception remain.  It was recommended to her that she take a urine pregnancy test in 2 weeks.  If still positive, call my office and let me know and we would obtain a quantitative beta-hCG.  All questions answered regarding the nature of spontaneous abortion and its effects on future pregnancies.  15 minutes spent in face to face discussion with > 50% spent in counseling,management, and coordination of care of her spontaneous abortion.  Thomasene Mohair, MD, [facility], [facility] 10/30/2018 5:24 PM

## 2018-11-06 ENCOUNTER — Other Ambulatory Visit: Payer: 59

## 2018-11-06 ENCOUNTER — Encounter: Payer: 59 | Admitting: Obstetrics and Gynecology

## 2018-12-03 ENCOUNTER — Telehealth: Payer: Self-pay

## 2018-12-03 DIAGNOSIS — Z20822 Contact with and (suspected) exposure to covid-19: Secondary | ICD-10-CM

## 2018-12-03 NOTE — Telephone Encounter (Signed)
Exposure and congestion. Shelby from [facility] called to get pt Covid testing. Pt scheduled 12/04/18 at 11:00 Pt instructed to stay in car and to wear mask to testing site. Pt given address of testing site.

## 2018-12-04 ENCOUNTER — Other Ambulatory Visit: Payer: 59

## 2018-12-04 DIAGNOSIS — Z20822 Contact with and (suspected) exposure to covid-19: Secondary | ICD-10-CM

## 2018-12-08 LAB — NOVEL CORONAVIRUS, NAA: SARS-CoV-2, NAA: NOT DETECTED

## 2018-12-12 ENCOUNTER — Other Ambulatory Visit: Payer: Self-pay

## 2018-12-12 DIAGNOSIS — Z20822 Contact with and (suspected) exposure to covid-19: Secondary | ICD-10-CM

## 2018-12-17 LAB — NOVEL CORONAVIRUS, NAA: SARS-CoV-2, NAA: NOT DETECTED

## 2019-02-16 ENCOUNTER — Other Ambulatory Visit: Payer: Self-pay

## 2019-02-16 ENCOUNTER — Emergency Department
Admission: EM | Admit: 2019-02-16 | Discharge: 2019-02-16 | Disposition: A | Discharge status: Home / Self Care | Payer: 59 | Attending: Emergency Medicine | Admitting: Emergency Medicine

## 2019-02-16 ENCOUNTER — Emergency Department: Payer: 59

## 2019-02-16 DIAGNOSIS — S81802A Unspecified open wound, left lower leg, initial encounter: Secondary | ICD-10-CM | POA: Diagnosis not present

## 2019-02-16 DIAGNOSIS — L0232 Furuncle of buttock: Secondary | ICD-10-CM | POA: Diagnosis not present

## 2019-02-16 DIAGNOSIS — Y929 Unspecified place or not applicable: Secondary | ICD-10-CM | POA: Insufficient documentation

## 2019-02-16 DIAGNOSIS — Y998 Other external cause status: Secondary | ICD-10-CM | POA: Insufficient documentation

## 2019-02-16 DIAGNOSIS — W3400XA Accidental discharge from unspecified firearms or gun, initial encounter: Secondary | ICD-10-CM | POA: Diagnosis not present

## 2019-02-16 DIAGNOSIS — Y9389 Activity, other specified: Secondary | ICD-10-CM | POA: Insufficient documentation

## 2019-02-16 DIAGNOSIS — S8992XA Unspecified injury of left lower leg, initial encounter: Secondary | ICD-10-CM | POA: Diagnosis present

## 2019-02-16 LAB — POCT PREGNANCY, URINE: Preg Test, Ur: NEGATIVE

## 2019-02-16 MED ORDER — CEPHALEXIN 250 MG PO CAPS: 250.0000 mg | ORAL_CAPSULE | Freq: Four times a day (QID) | ORAL | 0 refills | Status: DC

## 2019-02-16 MED ORDER — MUPIROCIN 2 % EX OINT: TOPICAL_OINTMENT | CUTANEOUS | 0 refills | Status: DC

## 2019-02-16 MED ORDER — CEPHALEXIN 250 MG PO CAPS: 250.0000 mg | ORAL_CAPSULE | Freq: Four times a day (QID) | ORAL | 0 refills | Status: AC
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
## 2019-02-16 NOTE — ED Notes (Signed)
Chaperoned MD with exam to rectal area where pt states she has a "boil". Pt with small area of redness with white head noted to right inner buttock. No rash noted to legs, no skin sloughing noted to legs. Pt appears in no acute distress.
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
## 2019-02-17 ENCOUNTER — Encounter (HOSPITAL_COMMUNITY): Payer: Self-pay

## 2019-02-17 ENCOUNTER — Encounter: Payer: Self-pay | Admitting: Family Medicine

## 2019-02-26 ENCOUNTER — Other Ambulatory Visit: Payer: Self-pay

## 2019-02-26 ENCOUNTER — Emergency Department
Admission: EM | Admit: 2019-02-26 | Discharge: 2019-02-26 | Disposition: A | Discharge status: Home / Self Care | Payer: 59 | Attending: Emergency Medicine | Admitting: Emergency Medicine

## 2019-02-26 ENCOUNTER — Other Ambulatory Visit: Payer: Self-pay | Admitting: Obstetrics and Gynecology

## 2019-02-26 ENCOUNTER — Encounter: Payer: Self-pay | Admitting: Obstetrics and Gynecology

## 2019-02-26 DIAGNOSIS — Z3202 Encounter for pregnancy test, result negative: Secondary | ICD-10-CM | POA: Insufficient documentation

## 2019-02-26 DIAGNOSIS — N926 Irregular menstruation, unspecified: Secondary | ICD-10-CM

## 2019-02-26 DIAGNOSIS — N939 Abnormal uterine and vaginal bleeding, unspecified: Secondary | ICD-10-CM | POA: Diagnosis present

## 2019-02-26 LAB — BASIC METABOLIC PANEL
Anion gap: 11 (ref 5–15)
BUN: 7 mg/dL (ref 6–20)
CO2: 21 mmol/L — ABNORMAL LOW (ref 22–32)
Calcium: 9 mg/dL (ref 8.9–10.3)
Chloride: 107 mmol/L (ref 98–111)
Creatinine, Ser: 0.91 mg/dL (ref 0.44–1.00)
GFR calc Af Amer: >60 mL/min (ref >60)
GFR calc non Af Amer: >60 mL/min (ref >60)
Glucose, Bld: 93 mg/dL (ref 70–99)
Potassium: 3.1 mmol/L — ABNORMAL LOW (ref 3.5–5.1)
Sodium: 139 mmol/L (ref 135–145)

## 2019-02-26 LAB — CBC
HCT: 38.1 % (ref 36.0–46.0)
Hemoglobin: 11.7 g/dL — ABNORMAL LOW (ref 12.0–15.0)
MCH: 22.7 pg — ABNORMAL LOW (ref 26.0–34.0)
MCHC: 30.7 g/dL (ref 30.0–36.0)
MCV: 74 fL — ABNORMAL LOW (ref 80.0–100.0)
Platelets: 425 10*3/uL — ABNORMAL HIGH (ref 150–400)
RBC: 5.15 MIL/uL — ABNORMAL HIGH (ref 3.87–5.11)
RDW: 17.4 % — ABNORMAL HIGH (ref 11.5–15.5)
WBC: 7.2 10*3/uL (ref 4.0–10.5)
nRBC: 0 % (ref 0.0–0.2)

## 2019-02-26 LAB — HCG, QUANTITATIVE, PREGNANCY: hCG, Beta Chain, Quant, S: 2 m[IU]/mL (ref <5)

## 2019-02-26 LAB — POCT PREGNANCY, URINE: Preg Test, Ur: NEGATIVE

## 2019-02-26 MED ORDER — DOXYCYCLINE HYCLATE 100 MG PO TABS
100.0000 mg | ORAL_TABLET | Freq: Once | ORAL | Status: AC
  Administered 2019-02-26: 20:00:00 100 mg via ORAL
  Filled 2019-02-26: qty 1

## 2019-02-26 MED ORDER — DOXYCYCLINE MONOHYDRATE 100 MG PO TABS: 100.0000 mg | ORAL_TABLET | Freq: Two times a day (BID) | ORAL | 0 refills | Status: DC
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
## 2019-02-26 NOTE — ED Triage Notes (Signed)
Pt to the er for vaginal bleeding. Pt states "I think I am pregnant and having a miscarriage." Pt took pregnancy test at home and stated it read an error message. Pt was due to start her period any day. Asked pt if she though she was just starting her period, pt states she had a miscarriage in May and it feels the same. Pt describes extreme cramping, temp up and down, trouble sleeping, restlessness. Pt OBGYN is [facility].

## 2019-02-26 NOTE — Discharge Instructions (Addendum)
Your pregnancy hormone level was consistent with not being pregnant.  Your pelvic exam is okay.  I think you are having a bad menstrual period.  Please take Motrin 4 of the over-the-counter pills 3 times a day for about 2 or 3 days.  Make sure you take it with food so it does not upset your stomach.  Please return for heavier bleeding, worse pain. Fever, weakness or lightheadedness, vomiting or feeling sicker.  Since this exam with little more tender than before I will give you some doxycycline just in case.  1 twice a day.  Finish the Keflex as well.

## 2019-02-26 NOTE — ED Provider Notes (Addendum)
[facility] Emergency Department Provider Note   ____________________________________________   First MD Initiated Contact with Patient 02/26/19 1844     (approximate)  I have reviewed the triage vital signs and the nursing notes.   HISTORY  Chief Complaint Vaginal Bleeding   HPI Karen May is a 23 y.o. female who reports a lot of cramping and bleeding.  She says it feels just like when she had her last miscarriage.  She comes in to be evaluated.  Pregnancy test is negative.  We will wait on the hCG.         Past Medical History:  Diagnosis Date  . Anemia   . Eczema 2010  . Environmental allergies     Patient Active Problem List   Diagnosis Date Noted  . Supervision of low-risk first pregnancy, unspecified trimester 09/25/2018  . Marijuana use 09/25/2018    Past Surgical History:  Procedure Laterality Date  . WISDOM TOOTH EXTRACTION      Prior to Admission medications   Medication Sig Start Date End Date Taking? Authorizing Provider  cephALEXin (KEFLEX) 250 MG capsule Take 1 capsule (250 mg total) by mouth 4 (four) times daily for 10 days. 02/16/19 02/26/19 Yes Emily Filbert, MD  mupirocin ointment Idelle Jo) 2 % Apply to affected area 3 times daily 02/16/19 02/16/20  Emily Filbert, MD  Prenat-Fe Carbonyl-FA-Omega 3 (ONE-A-DAY WOMENS PRENATAL 1) 28-0.8-235 MG CAPS Take by mouth.    [provider]    Allergies Patient has no known allergies.  Family History  Problem Relation Age of Onset  . Lung cancer Maternal Grandmother   . Diabetes Paternal Grandmother   . Breast cancer Maternal Aunt 35       maternal great aunt  . Cancer Cousin   . Breast cancer Cousin 27  . Schizophrenia Maternal Aunt   . Diabetes Cousin     Social History Social History   Tobacco Use  . Smoking status: Never Smoker  . Smokeless tobacco: Never Used  Substance Use Topics  . Alcohol use: Not Currently    Comment: occ  .  Drug use: Not Currently    Types: Marijuana    Review of Systems  Constitutional: No fever/chills Eyes: No visual changes. ENT: No sore throat. Cardiovascular: Denies chest pain. Respiratory: Denies shortness of breath. Gastrointestinal: Suprapubic abdominal pain.  No nausea, no vomiting.  No diarrhea.  No constipation. Genitourinary: Negative for dysuria. Musculoskeletal: Negative for back pain. Skin: Negative for rash. Neurological: Negative for headaches, focal weakness   ____________________________________________   PHYSICAL EXAM:  VITAL SIGNS: ED Triage Vitals  Enc Vitals Group     BP 02/26/19 1724 129/80     Pulse Rate 02/26/19 1724 85     Resp 02/26/19 1724 18     Temp 02/26/19 1724 97.7 F (36.5 C)     Temp Source 02/26/19 1724 Oral     SpO2 02/26/19 1724 100 %     Weight 02/26/19 1725 143 lb (64.9 kg)     Height 02/26/19 1725 5\' 6"  (1.676 m)     Head Circumference --      Peak Flow --      Pain Score 02/26/19 1724 9     Pain Loc --      Pain Edu? --      Excl. in GC? --     Constitutional: Alert and oriented. Well appearing and in no acute distress. Eyes: Conjunctivae are normal.  Head: Atraumatic. Nose:  No congestion/rhinnorhea. Mouth/Throat: Mucous membranes are moist.  Oropharynx non-erythematous. Neck: No stridor.  Cardiovascular: Normal rate, regular rhythm. Grossly normal heart sounds.  Good peripheral circulation. Respiratory: Normal respiratory effort.  No retractions. Lungs CTAB. Gastrointestinal: Soft and nontender except for suprapubically. No distention. No abdominal bruits. No CVA tenderness. Genitourinary: Normal perineum.  Normal vagina although there is some dark blood present.  There is no active bleeding.  Patient reports slightly more tenderness that her last 2-3 exams.  Musculoskeletal: No lower extremity tenderness nor edema.   Neurologic:  Normal speech and language. No gross focal neurologic deficits are appreciated.  Skin:  Skin  is warm, dry and intact. No rash noted.   ____________________________________________   LABS (all labs ordered are listed, but only abnormal results are displayed)  Labs Reviewed  CBC - Abnormal; Notable for the following components:      Result Value   RBC 5.15 (*)    Hemoglobin 11.7 (*)    MCV 74.0 (*)    MCH 22.7 (*)    RDW 17.4 (*)    Platelets 425 (*)    All other components within normal limits  BASIC METABOLIC PANEL - Abnormal; Notable for the following components:   Potassium 3.1 (*)    CO2 21 (*)    All other components within normal limits  HCG, QUANTITATIVE, PREGNANCY  POC URINE PREG, ED  POCT PREGNANCY, URINE   ____________________________________________  EKG   ____________________________________________  RADIOLOGY  ED MD interpretation:    Official radiology report(s): No results found.  ____________________________________________   PROCEDURES  Procedure(s) performed (including Critical Care):  Procedures   ____________________________________________   INITIAL IMPRESSION / ASSESSMENT AND PLAN / ED COURSE  [name] was evaluated in Emergency Department on 02/26/2019 for the symptoms described in the history of present illness. She was evaluated in the context of the global COVID-19 pandemic, which necessitated consideration that the patient might be at risk for infection with the SARS-CoV-2 virus that causes COVID-19. Institutional protocols and algorithms that pertain to the evaluation of patients at risk for COVID-19 are in a state of rapid change based on information released by regulatory bodies including the CDC and federal and state organizations. These policies and algorithms were followed during the patient's care in the ED.    Patient's hCG is 2 which is negative.  Regular pregnancy test is negative as well.  Patient has no cervical motion tenderness no active bleeding.  I do not think there is a need for an ultrasound now  since her last ultrasound was less than 4 months ago.  She can follow-up with her doctors.          ____________________________________________   FINAL CLINICAL IMPRESSION(S) / ED DIAGNOSES  Final diagnoses:  Vaginal bleeding     ED Discharge Orders    None       Note:  This document was prepared using Dragon voice recognition software and may include unintentional dictation errors.    Nena Polio, MD 02/26/19 Langston Reusing    Nena Polio, MD 02/26/19 2009

## 2019-02-26 NOTE — ED Notes (Signed)
This RN entered to assess the pt. She says that she is coming in for weakness. She states "I think it is coming from the antibiotics" I asked when she started the antibiotics, she says it was 10 days ago for an abcess and she was suppose to be finished today but says the antibiotics made her nausea and did not complete them. Pt states, "I have about 18-20 pills left".  This RN also about her vaginal bleeding. She states it started today. I asked how many pads she thought she had gone through today. Pt states, "I don't have any pads, I usually use tampons but I did not want to use the tampons today. I am not wearing anything to catch it."

## 2019-02-27 ENCOUNTER — Other Ambulatory Visit: Payer: Self-pay

## 2019-03-01 LAB — GC/CHLAMYDIA PROBE AMP
Chlamydia trachomatis, NAA: NEGATIVE
Neisseria Gonorrhoeae by PCR: NEGATIVE

## 2019-03-11 ENCOUNTER — Encounter: Payer: Self-pay | Admitting: Advanced Practice Midwife

## 2019-03-21 ENCOUNTER — Encounter: Payer: Self-pay | Admitting: Obstetrics and Gynecology

## 2019-03-21 ENCOUNTER — Other Ambulatory Visit: Payer: Self-pay

## 2019-03-21 ENCOUNTER — Ambulatory Visit (INDEPENDENT_AMBULATORY_CARE_PROVIDER_SITE_OTHER): Payer: 59 | Admitting: Obstetrics and Gynecology

## 2019-03-21 VITALS — BP 124/78 | Ht 65.0 in | Wt 138.0 lb

## 2019-03-21 DIAGNOSIS — O039 Complete or unspecified spontaneous abortion without complication: Secondary | ICD-10-CM

## 2019-03-21 NOTE — Progress Notes (Signed)
Obstetrics & Gynecology Office Visit   Chief Complaint  Patient presents with  . Follow-up  Miscarriage  History of Present Illness: 23 y.o. G1P0010 who presents in follow up from an ER visit on 02/26/2019.  At that visit she reported vaginal bleeding with cramping. Her pregnancy test was negative (both urine and blood).  Since her last appointment in June she had bleeding for 1 month. She kept noting something coming out that didn't quite look like blood.  She had a time where her temperature dropped and her pain was severe.  She states that she feels like everything has passed at this point. She states that she can feel the difference.   She states that she has been struggling emotionally and is seeing a therapist. She has no acute concerns today.    Past Medical History:  Diagnosis Date  . Anemia   . Eczema 2010  . Environmental allergies     Past Surgical History:  Procedure Laterality Date  . WISDOM TOOTH EXTRACTION      Gynecologic History: No LMP recorded.  Obstetric History: G1P0000  Family History  Problem Relation Age of Onset  . Lung cancer Maternal Grandmother   . Diabetes Paternal Grandmother   . Breast cancer Maternal Aunt 26       maternal great aunt  . Cancer Cousin   . Breast cancer Cousin 30  . Schizophrenia Maternal Aunt   . Diabetes Cousin     Social History   Socioeconomic History  . Marital status: Significant Other    Spouse name: 63  . Number of children: 0  . Years of education: 16  . Highest education level: Bachelor's degree (e.g., BA, AB, BS)  Occupational History  . Occupation: Rehab specialist  Social Needs  . Financial resource strain: Not on file  . Food insecurity    Worry: Not on file    Inability: Not on file  . Transportation needs    Medical: Not on file    Non-medical: Not on file  Tobacco Use  . Smoking status: Never Smoker  . Smokeless tobacco: Never Used  Substance and Sexual Activity  . Alcohol use: Not  Currently    Comment: occ  . Drug use: Not Currently    Types: Marijuana  . Sexual activity: Yes    Partners: Male    Birth control/protection: None  Lifestyle  . Physical activity    Days per week: Not on file    Minutes per session: Not on file  . Stress: Not on file  Relationships  . Social Herbalist on phone: Not on file    Gets together: Not on file    Attends religious service: Not on file    Active member of club or organization: Not on file    Attends meetings of clubs or organizations: Not on file    Relationship status: Not on file  . Intimate partner violence    Fear of current or ex partner: Not on file    Emotionally abused: Not on file    Physically abused: Not on file    Forced sexual activity: Not on file  Other Topics Concern  . Not on file  Social History Narrative   ** Merged History Encounter **        No Known Allergies  Prior to Admission medications   Medication Sig Start Date End Date Taking? Authorizing Provider  Prenat-Fe Carbonyl-FA-Omega 3 (ONE-A-DAY WOMENS PRENATAL 1) 28-0.8-235 MG CAPS Take  by mouth.   Yes [provider]    Review of Systems  Constitutional: Negative.   HENT: Negative.   Eyes: Negative.   Respiratory: Negative.   Cardiovascular: Negative.   Gastrointestinal: Negative.   Genitourinary: Negative.   Musculoskeletal: Negative.   Skin: Negative.   Neurological: Negative.   Psychiatric/Behavioral: Positive for depression. Negative for hallucinations, memory loss, substance abuse and suicidal ideas. The patient is not nervous/anxious and does not have insomnia.      Physical Exam BP 124/78   Ht 5\' 5"  (1.651 m)   Wt 138 lb (62.6 kg)   BMI 22.96 kg/m  No LMP recorded. Physical Exam Constitutional:      General: She is not in acute distress.    Appearance: Normal appearance.     Comments: tearful  HENT:     Head: Normocephalic and atraumatic.  Eyes:     General: No scleral icterus.     Conjunctiva/sclera: Conjunctivae normal.  Neurological:     General: No focal deficit present.     Mental Status: She is alert and oriented to person, place, and time.     Cranial Nerves: No cranial nerve deficit.  Psychiatric:        Mood and Affect: Mood normal.        Behavior: Behavior normal.        Judgment: Judgment normal.     Female chaperone present for pelvic and breast  portions of the physical exam  Assessment: 23 y.o. G55P0000 female here for  1. Spontaneous abortion      Plan: Problem List Items Addressed This Visit    None    Visit Diagnoses    Spontaneous abortion    -  Primary     Discussed her miscarriage. She is quite tearful and is seeing a G0P40. I encouraged her to call, if she had any questions. We briefly talked about birth control. However, she is not at a place where she is ready to discuss these options.  Consolation was attempted.  She was quite shaken just to be back at the clinic.  Follow up for routine care or for any concerns.   Veterinary surgeon, MD 03/21/2019 5:37 PM

## 2019-04-04 ENCOUNTER — Encounter: Payer: Self-pay | Admitting: Certified Nurse Midwife

## 2019-04-04 ENCOUNTER — Telehealth: Payer: Self-pay
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
## 2019-04-04 NOTE — Telephone Encounter (Signed)
Please call pt and schedule an appt (VIRTUAL OK) with SDJ ASAP per CLG.

## 2019-04-04 NOTE — Telephone Encounter (Signed)
Patient is schedule for 04/07/19 with SDJ

## 2019-04-04 NOTE — Telephone Encounter (Signed)
Please get this patient an appointment with Dr Royetta Crochet be virtual, since he saw her last, ASAP.

## 2019-04-07 ENCOUNTER — Ambulatory Visit (INDEPENDENT_AMBULATORY_CARE_PROVIDER_SITE_OTHER): Payer: 59 | Admitting: Obstetrics and Gynecology

## 2019-04-07 ENCOUNTER — Other Ambulatory Visit: Payer: Self-pay

## 2019-04-07 ENCOUNTER — Telehealth: Payer: Self-pay | Admitting: Obstetrics and Gynecology

## 2019-04-07 ENCOUNTER — Encounter: Payer: Self-pay | Admitting: Obstetrics and Gynecology

## 2019-04-07 DIAGNOSIS — F419 Anxiety disorder, unspecified: Secondary | ICD-10-CM

## 2019-04-07 DIAGNOSIS — F329 Major depressive disorder, single episode, unspecified: Secondary | ICD-10-CM

## 2019-04-07 NOTE — Telephone Encounter (Signed)
-----   Message from Will Bonnet, MD sent at 04/07/2019  8:59 AM EST ----- Regarding: Follow up appt Please call patient to schedule a follow up appointment by telephone in one month. Thank you! Prentice Docker, MD

## 2019-04-07 NOTE — Progress Notes (Signed)
    Virtual Visit via Telephone Note  I connected with Karen May on 04/07/19 at  8:20 AM EST by telephone and verified that I am speaking with the correct person using two identifiers.   I discussed the limitations, risks, security and privacy concerns of performing an evaluation and management service by telephone and the availability of in person appointments. I also discussed with the patient that there may be a patient responsible charge related to this service. The patient expressed understanding and agreed to proceed.  The patient was at work I spoke with the patient from my  office The names of people involved in this encounter were: Karen May and Prentice Docker, MD .   History of Present Illness: 23 y.o. G48P0010 female who presents via telephone due to an increase in anxiety and depression.  The patient states that she has been having bad anxiety and depression.  She was prescribed Latuda and had some adverse side effects.  The medication causes her to sleep too much (sleep through her alarm) and agitation.  Her psychiatrist prescribed the medication for her. She is no longer taking the medication.  She told them that she did not want to take any medication. She wants to take a more holistic approach.  She is meditating, talking with her pastor, exercising.  She states that her work is more stressful than ever. They are understaffed.  She requests a week of paid medical leave.  She states that she has the ability to obtain paid medical leave.  She believes that her work is keeping her from getting her feet back underneath her.  She denies SI/HI.    Observations/Objective: Physical Exam could not be performed. Because of the COVID-19 outbreak this visit was performed over the phone and not in person.   Assessment and Plan:   ICD-10-CM   1. Anxiety and depression  F41.9    F32.9     Follow Up Instructions: Follow up 1 month.    It is my assessment that the patient  needs some time away from work to collect herself.  Her work is quite stressful to her and she has tried medication therapy, which she had a reaction to that forced her to stop taking the medication.  She is currently using alternative forms of treatment, including therapy through other avenues and self-healing techniques such as meditation.  In her discussion with me and based on her EPDS today (19), she is certainly at risk for worsening depression.  We will grant her 1 week away from work.  We will follow-up with her in 1 month to check in on her.  If she needs to be seen prior to that, she knows to call the office and we will schedule another appointment.  Email: (see demographics)   I discussed the assessment and treatment plan with the patient. The patient was provided an opportunity to ask questions and all were answered. The patient agreed with the plan and demonstrated an understanding of the instructions.   The patient was advised to call back or seek an in-person evaluation if the symptoms worsen or if the condition fails to improve as anticipated.  I provided 18:42 minutes of non-face-to-face time during this encounter.  Prentice Docker, MD  [facility] 04/07/2019 8:47 AM

## 2019-04-07 NOTE — Telephone Encounter (Signed)
Called and left voice mail for patient to call back to be schedule °

## 2019-05-05 ENCOUNTER — Encounter: Payer: Self-pay | Admitting: Obstetrics and Gynecology

## 2019-05-05 ENCOUNTER — Other Ambulatory Visit: Payer: Self-pay

## 2019-05-05 ENCOUNTER — Ambulatory Visit (INDEPENDENT_AMBULATORY_CARE_PROVIDER_SITE_OTHER): Payer: 59 | Admitting: Obstetrics and Gynecology

## 2019-05-05 DIAGNOSIS — F419 Anxiety disorder, unspecified: Secondary | ICD-10-CM

## 2019-05-05 DIAGNOSIS — F329 Major depressive disorder, single episode, unspecified: Secondary | ICD-10-CM | POA: Diagnosis not present

## 2019-05-05 NOTE — Progress Notes (Signed)
    Virtual Visit via Telephone Note  I connected with Karen May on 05/05/19 at 11:10 AM EST by telephone and verified that I am speaking with the correct person using two identifiers.   I discussed the limitations, risks, security and privacy concerns of performing an evaluation and management service by telephone and the availability of in person appointments. I also discussed with the patient that there may be a patient responsible charge related to this service. The patient expressed understanding and agreed to proceed.  The patient was at home I spoke with the patient from my  office The names of people involved in this encounter were: Karen May and Prentice Docker, MD.   History of Present Illness: 23 y.o. G49P0010 female who presents in follow up via telephone. She has been doing well. She is off [place].  Her anxiety is improved since our last conversation. She states work is still very overwhelming.  She is doing great, much better.  She recently had a positive pregnancy test. Her last period was 03/29/2019.    Observations/Objective: Physical Exam could not be performed. Because of the COVID-19 outbreak this visit was performed over the phone and not in person.   Assessment and Plan: 23 y.o. G95P0010 female with anxiety and depression that is much improved.   Follow Up Instructions: As needed. I recommended she schedule a NOB appointment for her newly-diagnosed pregnancy.    I discussed the assessment and treatment plan with the patient. The patient was provided an opportunity to ask questions and all were answered. The patient agreed with the plan and demonstrated an understanding of the instructions.   The patient was advised to call back or seek an in-person evaluation if the symptoms worsen or if the condition fails to improve as anticipated.  I provided 11:12 minutes of non-face-to-face time during this encounter.  Prentice Docker, MD  [facility] 05/05/2019 11:57 AM

## 2019-05-15 ENCOUNTER — Ambulatory Visit (INDEPENDENT_AMBULATORY_CARE_PROVIDER_SITE_OTHER): Payer: 59 | Admitting: Obstetrics and Gynecology

## 2019-05-15 ENCOUNTER — Other Ambulatory Visit (HOSPITAL_COMMUNITY)
Admission: RE | Admit: 2019-05-15 | Discharge: 2019-05-15 | Disposition: A | Discharge status: Home / Self Care | Payer: 59 | Source: Ambulatory Visit | Attending: Obstetrics and Gynecology | Admitting: Obstetrics and Gynecology

## 2019-05-15 ENCOUNTER — Other Ambulatory Visit: Payer: Self-pay

## 2019-05-15 ENCOUNTER — Encounter: Payer: Self-pay | Admitting: Obstetrics and Gynecology

## 2019-05-15 VITALS — BP 106/60 | HR 79 | Wt 140.0 lb

## 2019-05-15 DIAGNOSIS — Z3481 Encounter for supervision of other normal pregnancy, first trimester: Secondary | ICD-10-CM

## 2019-05-15 DIAGNOSIS — Z3201 Encounter for pregnancy test, result positive: Secondary | ICD-10-CM | POA: Diagnosis not present

## 2019-05-15 DIAGNOSIS — Z3400 Encounter for supervision of normal first pregnancy, unspecified trimester: Secondary | ICD-10-CM | POA: Diagnosis present

## 2019-05-15 DIAGNOSIS — Z3A01 Less than 8 weeks gestation of pregnancy: Secondary | ICD-10-CM

## 2019-05-15 DIAGNOSIS — N912 Amenorrhea, unspecified: Secondary | ICD-10-CM

## 2019-05-15 LAB — OB RESULTS CONSOLE VARICELLA ZOSTER ANTIBODY, IGG: Varicella: IMMUNE

## 2019-05-15 LAB — OB RESULTS CONSOLE RUBELLA ANTIBODY, IGM: Rubella: IMMUNE

## 2019-05-15 LAB — OB RESULTS CONSOLE GC/CHLAMYDIA: Gonorrhea: NEGATIVE

## 2019-05-15 LAB — POCT URINE PREGNANCY: Preg Test, Ur: POSITIVE — AB

## 2019-05-15 NOTE — Progress Notes (Signed)
New Obstetric Patient H&P   Chief Complaint: "Desires prenatal care"   History of Present Illness: Patient is a 23 y.o. G2P0010 Not Hispanic or Latino female, confident LMP 03/22/2019 presents with amenorrhea and positive home pregnancy test. Based on her  LMP, her EDD is Estimated Date of Delivery: 12/27/19 and her EGA is 7116w5d. Cycles are  days, regular, and occur approximately every : 24-28 days. Her last pap smear was 8 months ago and was no abnormalities.    She had a urine pregnancy test which was positive 2 week(s)  ago. Her last menstrual period was normal and lasted for  4 day(s). Since her LMP she claims she has experienced no issues. She denies vaginal bleeding. Her past medical history is noncontributory. Her prior pregnancies are notable for G1 with SAB at her 3 month check-up.   Since her LMP, she admits to the use of tobacco products  no She claims she has gained a few pounds since the start of her pregnancy.  There are cats in the home in the home  no  She admits close contact with children on a regular basis  yes  She has had chicken pox in the past unknown She has had Tuberculosis exposures, symptoms, or previously tested positive for TB   no Current or past history of domestic violence. no  Genetic Screening/Teratology Counseling: (Includes patient, baby's father, or anyone in either family with:)   1. [facility] 59[facility]  [facility] 2. Thalassemia ([place], [place], Mediterranean, or Asian background): MCV<80  no 3. Neural tube defect (meningomyelocele, spina bifida, anencephaly)  no 4. Congenital heart defect  no  5. Down syndrome  no 6. Tay-Sachs (Jewish, [place])  no 7. Canavan's Disease  no 8. Sickle cell disease or trait (African)  no  9. Hemophilia or other blood disorders  no  10. Muscular dystrophy  no  11. Cystic fibrosis  no  12. Huntington's Chorea  no  13. Mental retardation/autism  no 14. Other inherited genetic or chromosomal disorder  no 15.  Maternal metabolic disorder (DM, PKU, etc)  no 16. Patient or FOB with a child with a birth defect not listed above no  16a. Patient or FOB with a birth defect themselves no 17. Recurrent pregnancy loss, or stillbirth  no  18. Any medications since LMP other than prenatal vitamins (include vitamins, supplements, OTC meds, drugs, alcohol)  no 19. Any other genetic/environmental exposure to discuss  no  Infection History:   1. Lives with someone with TB or TB exposed  no  2. Patient or partner has history of genital herpes  no 3. Rash or viral illness since LMP  no 4. History of STI (GC, CT, HPV, syphilis, HIV)  no 5. History of recent travel :  no  Other pertinent information:  no   Review of Systems:10 point review of systems negative unless otherwise noted in HPI  Past Medical History:  Diagnosis Date  . Anemia   . Anxiety   . Eczema 2010  . Environmental allergies     Past Surgical History:  Procedure Laterality Date  . WISDOM TOOTH EXTRACTION      Gynecologic History: Patient's last menstrual period was 03/22/2019 (exact date).  Obstetric History: G2P0010, 1st trimester SAB (spontaneous passage)  Family History  Problem Relation Age of Onset  . Lung cancer Maternal Grandmother   . Diabetes Paternal Grandmother   . Breast cancer Maternal Aunt 56       maternal great  aunt  . Cancer Cousin   . Breast cancer Cousin 68  . Schizophrenia Maternal Aunt   . Diabetes Cousin     Social History   Socioeconomic History  . Marital status: Significant Other    Spouse name: 57  . Number of children: 0  . Years of education: 16  . Highest education level: Bachelor's degree (e.g., BA, AB, BS)  Occupational History  . Occupation: Rehab specialist  Tobacco Use  . Smoking status: Never Smoker  . Smokeless tobacco: Never Used  Substance and Sexual Activity  . Alcohol use: Not Currently    Comment: occ  . Drug use: Not Currently  . Sexual activity: Yes    Partners:  Male    Birth control/protection: None  Other Topics Concern  . Not on file  Social History Narrative   ** Merged History Encounter **       Social Determinants of Health   Financial Resource Strain:   . Difficulty of Paying Living Expenses: Not on file  Food Insecurity:   . Worried About Charity fundraiser in the Last Year: Not on file  . Ran Out of Food in the Last Year: Not on file  Transportation Needs:   . Lack of Transportation (Medical): Not on file  . Lack of Transportation (Non-Medical): Not on file  Physical Activity:   . Days of Exercise per Week: Not on file  . Minutes of Exercise per Session: Not on file  Stress:   . Feeling of Stress : Not on file  Social Connections:   . Frequency of Communication with Friends and Family: Not on file  . Frequency of Social Gatherings with Friends and Family: Not on file  . Attends Religious Services: Not on file  . Active Member of Clubs or Organizations: Not on file  . Attends Archivist Meetings: Not on file  . Marital Status: Not on file  Intimate Partner Violence:   . Fear of Current or Ex-Partner: Not on file  . Emotionally Abused: Not on file  . Physically Abused: Not on file  . Sexually Abused: Not on file   Allergies: No Known Allergies  Prior to Admission medications   Medication Sig Start Date End Date Taking? Authorizing Provider  Prenat-Fe Carbonyl-FA-Omega 3 (ONE-A-DAY WOMENS PRENATAL 1) 28-0.8-235 MG CAPS Take by mouth.   Yes [provider]    Physical Exam BP 106/60   Pulse 79   Wt 140 lb (63.5 kg)   LMP 03/22/2019 (Exact Date)   BMI 23.30 kg/m   Physical Exam Vitals reviewed. Exam conducted with a chaperone present.  Constitutional:      General: She is not in acute distress.    Appearance: Normal appearance.  HENT:     Head: Normocephalic and atraumatic.  Eyes:     General: No scleral icterus.    Conjunctiva/sclera: Conjunctivae normal.  Cardiovascular:     Rate and  Rhythm: Normal rate and regular rhythm.     Heart sounds: No murmur. No friction rub. No gallop.   Pulmonary:     Effort: Pulmonary effort is normal.     Breath sounds: Normal breath sounds. No wheezing, rhonchi or rales.  Abdominal:     General: Bowel sounds are normal.     Palpations: There is no mass.     Tenderness: There is no abdominal tenderness. There is no guarding or rebound.     Hernia: No hernia is present.  Genitourinary:    General:  Normal vulva.     Exam position: Lithotomy position.     Tanner stage (genital): 5.     Labia:        Right: No rash, tenderness, lesion or injury.        Left: No rash, tenderness, lesion or injury.      Vagina: Normal.     Cervix: Normal.     Uterus: Normal.      Adnexa: Right adnexa normal and left adnexa normal.  Musculoskeletal:        General: No swelling. Normal range of motion.  Skin:    General: Skin is warm and dry.  Neurological:     General: No focal deficit present.     Mental Status: She is alert and oriented to person, place, and time.     Cranial Nerves: No cranial nerve deficit.  Psychiatric:        Mood and Affect: Mood normal.        Behavior: Behavior normal.        Judgment: Judgment normal.     Bedside U/S shows single, living IUP with CRL consistent with gestational age of abot [redacted]w[redacted]d.    Female Chaperone present during breast and/or pelvic exam.   Assessment: 23 y.o. G2P0010 at [redacted]w[redacted]d presenting to initiate prenatal care  Plan: 1) Avoid alcoholic beverages. 2) Patient encouraged not to smoke.  3) Discontinue the use of all non-medicinal drugs and chemicals.  4) Take prenatal vitamins daily.  5) Nutrition, food safety (fish, cheese advisories, and high nitrite foods) and exercise discussed. 6) Hospital and practice style discussed with cross coverage system.  7) Genetic Screening, such as with 1st Trimester Screening, cell free fetal DNA, AFP testing, and Ultrasound, as well as with amniocentesis and CVS as  appropriate, is discussed with patient. At the conclusion of today's visit patient undecided genetic testing 8) Patient is asked about travel to areas at risk for the [place] virus, and counseled to avoid travel and exposure to mosquitoes or sexual partners who may have themselves been exposed to the virus. Testing is discussed, and will be ordered as appropriate.  9) will get formal ultrasound to verify dating.  Thomasene Mohair, MD 05/15/2019 10:31 AM

## 2019-05-16 ENCOUNTER — Encounter: Payer: Self-pay | Admitting: Obstetrics and Gynecology

## 2019-05-16 LAB — RPR+RH+ABO+RUB AB+AB SCR+CB...
Antibody Screen: NEGATIVE
HIV Screen 4th Generation wRfx: NONREACTIVE
Hematocrit: 40.1 % (ref 34.0–46.6)
Hemoglobin: 12.8 g/dL (ref 11.1–15.9)
Hepatitis B Surface Ag: NEGATIVE
MCH: 25 pg — ABNORMAL LOW (ref 26.6–33.0)
MCHC: 31.9 g/dL (ref 31.5–35.7)
MCV: 78 fL — ABNORMAL LOW (ref 79–97)
Platelets: 325 10*3/uL (ref 150–450)
RBC: 5.13 x10E6/uL (ref 3.77–5.28)
RDW: 19 % — ABNORMAL HIGH (ref 11.7–15.4)
RPR Ser Ql: NONREACTIVE
Rh Factor: POSITIVE
Rubella Antibodies, IGG: 3.89 index (ref >0.99)
Varicella zoster IgG: 3211 index (ref >165)
WBC: 7.5 10*3/uL (ref 3.4–10.8)

## 2019-05-19 ENCOUNTER — Ambulatory Visit (INDEPENDENT_AMBULATORY_CARE_PROVIDER_SITE_OTHER): Payer: 59 | Admitting: Certified Nurse Midwife

## 2019-05-19 ENCOUNTER — Ambulatory Visit (INDEPENDENT_AMBULATORY_CARE_PROVIDER_SITE_OTHER): Payer: 59

## 2019-05-19 ENCOUNTER — Other Ambulatory Visit: Payer: Self-pay

## 2019-05-19 VITALS — BP 82/50 | Wt 140.0 lb

## 2019-05-19 DIAGNOSIS — Z3400 Encounter for supervision of normal first pregnancy, unspecified trimester: Secondary | ICD-10-CM

## 2019-05-19 DIAGNOSIS — O3481 Maternal care for other abnormalities of pelvic organs, first trimester: Secondary | ICD-10-CM

## 2019-05-19 DIAGNOSIS — F329 Major depressive disorder, single episode, unspecified: Secondary | ICD-10-CM | POA: Insufficient documentation

## 2019-05-19 DIAGNOSIS — Z3A01 Less than 8 weeks gestation of pregnancy: Secondary | ICD-10-CM

## 2019-05-19 DIAGNOSIS — N8311 Corpus luteum cyst of right ovary: Secondary | ICD-10-CM

## 2019-05-19 DIAGNOSIS — F419 Anxiety disorder, unspecified: Secondary | ICD-10-CM | POA: Insufficient documentation

## 2019-05-19 LAB — CERVICOVAGINAL ANCILLARY ONLY
Chlamydia: NEGATIVE
Comment: NEGATIVE
Comment: NEGATIVE
Comment: NORMAL
Neisseria Gonorrhea: NEGATIVE
Trichomonas: NEGATIVE

## 2019-05-19 LAB — POCT URINALYSIS DIPSTICK OB: Glucose, UA: NEGATIVE

## 2019-05-19 LAB — URINE DRUG PANEL 7
Amphetamines, Urine: NEGATIVE ng/mL
Barbiturate Quant, Ur: NEGATIVE ng/mL
Benzodiazepine Quant, Ur: NEGATIVE ng/mL
Cannabinoid Quant, Ur: POSITIVE — AB
Cocaine (Metab.): NEGATIVE ng/mL
Opiate Quant, Ur: NEGATIVE ng/mL
PCP Quant, Ur: NEGATIVE ng/mL

## 2019-05-19 LAB — URINE CULTURE

## 2019-05-19 NOTE — Progress Notes (Signed)
No problems.rj 

## 2019-05-19 NOTE — Progress Notes (Signed)
ROB at [place]: Doing well. Some nausea in the evenings, but keeping down most of what she eats. CRL today c/w 7wk4d changing EDC to 01/01/2020. (unsure LMP) FCA 154 NOB labs reviewed. Declines genetic testing ROB in 3-4 weeks for DT check  Dalia Heading, CNM

## 2019-05-30 NOTE — L&D Delivery Note (Signed)
Date of delivery: 12/29/2019 Estimated Date of Delivery: 01/01/20 Patient's last menstrual period was 03/22/2019 (exact date). EGA: [redacted]w[redacted]d  Delivery Note At 9:48 AM a viable female was delivered via Vaginal, Spontaneous  Presentation: OA, Right Occiput Anterior).  APGAR: 8, 9; weight: 2950 g, 6 pounds 8 ounces.   Placenta status: Spontaneous/intact; membranes removed with ring forceps  Cord: 3 vessels with the following complications: None.  Cord pH: NA  Went to see patient and she was found to be complete and +2.  She pushed to deliver a viable female infant.  The head followed by shoulders, which delivered without difficulty, and the rest of the body.  Nuchal cord noted and reduced on the perineum.  Baby to mom's chest.  Cord clamped and cut after > 1 min delay.  Cord blood obtained.  Placenta delivered spontaneously, intact, with a 3-vessel cord.  First degree perineal laceration hemostatic/not repaired.  All counts correct.  Hemostasis obtained with IV pitocin and fundal massage.   Anesthesia: Epidural Episiotomy: None Lacerations: 1st degree Suture Repair: no repair Est. Blood Loss (mL):  600  Mom to postpartum.  Baby to Couplet care / Skin to Skin.  Tresea Mall, CNM 12/29/2019, 10:35 AM

## 2019-06-02 ENCOUNTER — Other Ambulatory Visit: Payer: 59

## 2019-06-02 ENCOUNTER — Encounter: Payer: 59 | Admitting: Advanced Practice Midwife

## 2019-06-16 ENCOUNTER — Ambulatory Visit (INDEPENDENT_AMBULATORY_CARE_PROVIDER_SITE_OTHER): Payer: 59 | Admitting: Obstetrics & Gynecology

## 2019-06-16 ENCOUNTER — Other Ambulatory Visit: Payer: Self-pay

## 2019-06-16 ENCOUNTER — Encounter: Payer: Self-pay | Admitting: Obstetrics & Gynecology

## 2019-06-16 VITALS — BP 100/60 | Wt 142.0 lb

## 2019-06-16 DIAGNOSIS — Z3401 Encounter for supervision of normal first pregnancy, first trimester: Secondary | ICD-10-CM

## 2019-06-16 DIAGNOSIS — Z3400 Encounter for supervision of normal first pregnancy, unspecified trimester: Secondary | ICD-10-CM

## 2019-06-16 DIAGNOSIS — Z3A11 11 weeks gestation of pregnancy: Secondary | ICD-10-CM

## 2019-06-16 LAB — POCT URINALYSIS DIPSTICK OB
Glucose, UA: NEGATIVE
POC,PROTEIN,UA: NEGATIVE

## 2019-06-16 NOTE — Patient Instructions (Signed)

## 2019-06-16 NOTE — Progress Notes (Signed)
  Subjective  Fetal Movement? no Nausea? yes    Does not take or request meds Objective  BP 100/60   Wt 142 lb (64.4 kg)   LMP 03/22/2019 (Exact Date)   BMI 23.63 kg/m  General: NAD Pumonary: no increased work of breathing Abdomen: gravid, non-tender Extremities: no edema Psychiatric: mood appropriate, affect full  Assessment  24 y.o. G2P0010 at [redacted]w[redacted]d by  01/01/2020, by Ultrasound presenting for routine prenatal visit  Plan   Problem List Items Addressed This Visit      Other   Supervision of low-risk first pregnancy, unspecified trimester    Other Visit Diagnoses    [redacted] weeks gestation of pregnancy    -  Primary      Pregnancy#2 Problems (from 03/23/19 to present)    Problem Noted Resolved   Supervision of low-risk first pregnancy, unspecified trimester 09/25/2018 by Farrel Conners, CNM No   Overview Signed 09/25/2018  5:07 PM by Farrel Conners, CNM    [facility]inic [facility] Prenatal Labs  Dating  Blood type:     Genetic Screen 1 Screen:    AFP:     Quad:     NIPS: Antibody:   Anatomic [place]  Rubella:   Varicella: @VZVIGG @  GTT Early:               Third trimester:  RPR:     Rhogam  HBsAg:     TDaP vaccine                       Flu Shot: HIV:     Baby Food                                GBS:   Contraception  Pap:  CBB     CS/VBAC    Support Person             PNV  Declines NIPT  , MD, Annamarie Major Ob/Gyn, [facility] 06/16/2019  3:43 PM

## 2019-06-16 NOTE — Addendum Note (Signed)
Addended by: Cornelius Moras D on: 06/16/2019 04:17 PM   Modules accepted: Orders

## 2019-07-15 ENCOUNTER — Encounter: Payer: Self-pay | Admitting: Advanced Practice Midwife

## 2019-07-15 ENCOUNTER — Ambulatory Visit (INDEPENDENT_AMBULATORY_CARE_PROVIDER_SITE_OTHER): Payer: 59 | Admitting: Advanced Practice Midwife

## 2019-07-15 ENCOUNTER — Other Ambulatory Visit: Payer: Self-pay

## 2019-07-15 VITALS — BP 114/70 | Wt 148.0 lb

## 2019-07-15 DIAGNOSIS — Z3400 Encounter for supervision of normal first pregnancy, unspecified trimester: Secondary | ICD-10-CM

## 2019-07-15 DIAGNOSIS — Z3A15 15 weeks gestation of pregnancy: Secondary | ICD-10-CM

## 2019-07-15 DIAGNOSIS — Z3482 Encounter for supervision of other normal pregnancy, second trimester: Secondary | ICD-10-CM

## 2019-07-15 DIAGNOSIS — B373 Candidiasis of vulva and vagina: Secondary | ICD-10-CM | POA: Diagnosis not present

## 2019-07-15 DIAGNOSIS — B3731 Acute candidiasis of vulva and vagina: Secondary | ICD-10-CM

## 2019-07-15 MED ORDER — TERCONAZOLE 0.8 % VA CREA: 1.0000 | TOPICAL_CREAM | Freq: Every day | VAGINAL | 0 refills | Status: DC

## 2019-07-15 NOTE — Progress Notes (Signed)
No vb. No lof.  

## 2019-07-15 NOTE — Progress Notes (Signed)
  Routine Prenatal Care Visit  Subjective  Karen May is a 24 y.o. G2P0010 at [redacted]w[redacted]d being seen today for ongoing prenatal care.  She is currently monitored for the following issues for this low-risk pregnancy and has Supervision of low-risk first pregnancy, unspecified trimester; Marijuana use; Vaginal bleeding; and Anxiety and depression on their problem list.  ----------------------------------------------------------------------------------- Patient reports thick white vaginal discharge and itching for the past 2 weeks. She had a bikini area waxing shortly before the itching started and thought she was having a reaction to that. The symptoms have gotten worse since then. She has not tried OTC remedy.    . Vag. Bleeding: None.  Movement: Present. Leaking Fluid denies.  ----------------------------------------------------------------------------------- The following portions of the patient's history were reviewed and updated as appropriate: allergies, current medications, past family history, past medical history, past social history, past surgical history and problem list. Problem list updated.  Objective  Blood pressure 114/70, weight 148 lb (67.1 kg), last menstrual period 03/22/2019 Pregravid weight 132 lb (59.9 kg) Total Weight Gain 16 lb (7.258 kg) Urinalysis: Urine Protein    Urine Glucose    Fetal Status: Fetal Heart Rate (bpm): 155   Movement: Present     General:  Alert, oriented and cooperative. Patient is in no acute distress.  Skin: Skin is warm and dry. No rash noted.   Cardiovascular: Normal heart rate noted  Respiratory: Normal respiratory effort, no problems with respiration noted  Abdomen: Soft, gravid, appropriate for gestational age. Pain/Pressure: Present     Pelvic:  thick white discharge, wet prep positive for yeast, negative for clue cells or whiff        Extremities: Normal range of motion.  Edema: None  Mental Status: Normal mood and affect. Normal  behavior. Normal judgment and thought content.   Assessment   24 y.o. G2P0010 at [redacted]w[redacted]d by  01/01/2020, by Ultrasound presenting for routine prenatal visit  Plan   Pregnancy#2 Problems (from 03/23/19 to present)    Problem Noted Resolved   Supervision of low-risk first pregnancy, unspecified trimester 09/25/2018 by Farrel Conners, CNM No   Overview Signed 09/25/2018  5:07 PM by Farrel Conners, CNM    [facility] Prenatal Labs  Dating  Blood type:     Genetic Screen 1 Screen:    AFP:     Quad:     NIPS: Antibody:   Anatomic [place]  Rubella:   Varicella: @VZVIGG @  GTT Early:               Third trimester:  RPR:     Rhogam  HBsAg:     TDaP vaccine                       Flu Shot: HIV:     Baby Food                                GBS:   Contraception  Pap:  CBB     CS/VBAC    Support Person               Preterm labor symptoms and general obstetric precautions including but not limited to vaginal bleeding, contractions, leaking of fluid and fetal movement were reviewed in detail with the patient.   Return in about 4 weeks (around 08/12/2019) for anatomy scan and rob.  08/14/2019, CNM 07/15/2019 2:43 PM

## 2019-08-12 ENCOUNTER — Ambulatory Visit (INDEPENDENT_AMBULATORY_CARE_PROVIDER_SITE_OTHER): Payer: 59

## 2019-08-12 ENCOUNTER — Encounter: Payer: Self-pay | Admitting: Obstetrics & Gynecology

## 2019-08-12 ENCOUNTER — Other Ambulatory Visit: Payer: Self-pay

## 2019-08-12 ENCOUNTER — Ambulatory Visit (INDEPENDENT_AMBULATORY_CARE_PROVIDER_SITE_OTHER): Payer: 59 | Admitting: Obstetrics & Gynecology

## 2019-08-12 VITALS — BP 100/60 | Wt 153.0 lb

## 2019-08-12 DIAGNOSIS — Z131 Encounter for screening for diabetes mellitus: Secondary | ICD-10-CM

## 2019-08-12 DIAGNOSIS — Z3400 Encounter for supervision of normal first pregnancy, unspecified trimester: Secondary | ICD-10-CM | POA: Diagnosis not present

## 2019-08-12 DIAGNOSIS — Z3A19 19 weeks gestation of pregnancy: Secondary | ICD-10-CM

## 2019-08-12 DIAGNOSIS — Z3482 Encounter for supervision of other normal pregnancy, second trimester: Secondary | ICD-10-CM

## 2019-08-12 LAB — POCT URINALYSIS DIPSTICK OB
Glucose, UA: NEGATIVE
POC,PROTEIN,UA: NEGATIVE

## 2019-08-12 NOTE — Addendum Note (Signed)
Addended by: Cornelius Moras D on: 08/12/2019 03:25 PM   Modules accepted: Orders

## 2019-08-12 NOTE — Progress Notes (Signed)
  Subjective  Fetal Movement? yes Contractions? no Leaking Fluid? no Vaginal Bleeding? no  Objective  BP 100/60   Wt 153 lb (69.4 kg)   LMP 03/22/2019 (Exact Date)   BMI 25.46 kg/m  General: NAD Pumonary: no increased work of breathing Abdomen: gravid, non-tender Extremities: no edema Psychiatric: mood appropriate, affect full  Assessment  24 y.o. G2P0010 at [redacted]w[redacted]d by  01/01/2020, by Ultrasound presenting for routine prenatal visit  Plan   Problem List Items Addressed This Visit      Other   Supervision of low-risk first pregnancy, unspecified trimester    Other Visit Diagnoses    [redacted] weeks gestation of pregnancy    -  Primary   Screening for diabetes mellitus       Relevant Orders   28 Week RH+Panel      Pregnancy#2 Problems (from 03/23/19 to present)    Problem Noted Resolved   Supervision of low-risk first pregnancy, unspecified trimester 09/25/2018 by Farrel Conners, CNM No   Overview Addendum 08/12/2019  3:14 PM by Nadara Mustard, MD    [facility] Prenatal Labs  Dating LMP, [place] agrees Blood type: O/Positive/-- (12/17 1130)   Genetic Screen declines Antibody:Negative (12/17 1130)  Anatomic [place] [facility] nml Rubella: 3.89 (12/17 1130) Varicella: Imm  GTT       Third trimester:  RPR: Non Reactive (12/17 1130)   Rhogam n/a HBsAg: Negative (12/17 1130)   TDaP vaccine                  Flu Shot: HIV: Non Reactive (12/17 1130)   Baby Food   Breast                     GBS:   Contraception   POP Pap:08/2018 nml, repeat PP  CBB  no   CS/VBAC n/a   Support Person             Review of ULTRASOUND. I have personally reviewed images and report of recent ultrasound done at [facility]. There is a singleton gestation with subjectively normal amniotic fluid volume. The fetal biometry correlates with established dating. Detailed evaluation of the fetal anatomy was performed.The fetal anatomical survey appears within normal limits within the resolution of ultrasound as described  above.  It must be noted that a normal ultrasound is unable to rule out fetal aneuploidy.    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

## 2019-08-12 NOTE — Patient Instructions (Signed)
Prenatal Ultrasound A prenatal ultrasound exam, also called a sonogram, is an imaging test that allows your health care provider to see your baby and placenta in the uterus. This is a safe and painless test that does not expose you or your baby to any X-rays, needles, or medicines. Prenatal ultrasounds are done using a handheld plastic device (transducer) that sends out sound waves (ultrasound). The sound waves reflect off your baby's bones and other tissues to create moving images on a computer screen. There are two types of prenatal ultrasound:  Transabdominal ultrasound. During this test, a transducer is placed on your belly and moved around. A routine transabdominal ultrasound is usually done between weeks 18 and 22 of pregnancy (standard ultrasound). It may also be done between weeks 13 and 14.  Transvaginal ultrasound. During this test, a transducer that is shaped like a wand is placed inside your vagina. This type of ultrasound is usually done during early pregnancy. Prenatal ultrasounds may be used to check:  How far along your pregnancy is (stage).  Your baby's development (gestational age).  The location and condition of the organ that supplies your baby with nourishment and oxygen (placenta).  Your baby's heart rate, position, and movements.  Your baby's approximate size and weight.  The amount of fluid surrounding your baby (amniotic fluid).  If you are carrying more than one baby.  Your baby's sex (if your baby is in a position that allows the sex organs to be seen, and if you choose to learn the sex at this time).  If there are any possible problems that require more testing, such as genetic problems.  If your pregnancy is forming outside your uterus (ectopic pregnancy). You may have other ultrasounds as needed at any point during your pregnancy. If your health care provider suspects a problem, you may also have a more detailed type of transabdominal ultrasound (advanced  ultrasound). What are the risks? Generally, this is a safe test. There are no known risks for you or your baby from a prenatal ultrasound. What happens before the test?  Before a transabdominal ultrasound, you may be asked to drink fluid 2 hours before the exam and avoid emptying your bladder. A full bladder helps the images show up more clearly.  Before a transvaginal ultrasound, you may be asked to empty your bladder before the exam.  Wear loose, comfortable clothing so it is easy to undress or expose your lower belly for the exam. What happens during the test? If you are having a transabdominal ultrasound:  You will lie on an exam table.  Your belly will be exposed.  Gel will be rubbed over your belly.  The transducer will be pressed on your belly and moved back and forth, through the gel. You may feel slight pressure, but there should not be any pain.  You may be asked to change your position.  You may hear sounds of blood flow and your baby's heartbeat. You may be able to see images of your baby on the computer screen. Your health care provider may measure your baby's head and other body parts, looking for normal development.  After the exam, the gel will be cleaned off, and you can replace your clothing. You will be able to empty your bladder after the exam is done. If you are having a transvaginal ultrasound:  You will change into a hospital gown or undress from the waist down and cover yourself with a paper sheet.  You will lie down on   an exam table with your feet in footrests (stirrups).  The transducer will be covered with a protective cover and lubricated.  The transducer will be inserted into your vagina.  You may hear sounds of blood flow and your baby's heartbeat. You may be able to see images of your baby on the computer screen.  After the exam, the transducer will be removed, and you can put your clothes back on. What can I expect after the test?  You can  drive yourself home and return to all your normal activities.  A health care provider trained in interpreting ultrasounds will review the images taken during your exam and send a report to your health care provider.  It is up to you to get your test results. Ask your health care provider, or the department that is doing the test, when your results will be ready. Questions to ask your health care provider  Why am I having this prenatal ultrasound?  What information will this exam provide?  How much does this exam cost? What costs will my insurance cover?  Can my partner or support person be with me during the exam?  When can I expect to get the results? Summary  A prenatal ultrasound is a safe and painless imaging exam that gives information about your pregnancy and your developing baby.  Transvaginal ultrasound exams are often done in early pregnancy. Standard transabdominal ultrasounds are typically done between 18 and 22 weeks of pregnancy. You may have other prenatal ultrasounds as needed.  This exam has no risks for you or your baby. After the exam, you can go home and return to all your usual activities. This information is not intended to replace advice given to you by your health care provider. Make sure you discuss any questions you have with your health care provider. Document Revised: 09/06/2018 Document Reviewed: 07/18/2017 Elsevier Patient Education  2020 [employer]  

## 2019-09-09 ENCOUNTER — Encounter: Payer: Self-pay | Admitting: Advanced Practice Midwife

## 2019-09-09 ENCOUNTER — Other Ambulatory Visit: Payer: Self-pay

## 2019-09-09 ENCOUNTER — Telehealth (INDEPENDENT_AMBULATORY_CARE_PROVIDER_SITE_OTHER): Payer: 59 | Admitting: Advanced Practice Midwife

## 2019-09-09 VITALS — Ht 65.0 in | Wt 154.0 lb

## 2019-09-09 DIAGNOSIS — Z130 Encounter for screening for diseases of the blood and blood-forming organs and certain disorders involving the immune mechanism: Secondary | ICD-10-CM

## 2019-09-09 DIAGNOSIS — Z3A23 23 weeks gestation of pregnancy: Secondary | ICD-10-CM

## 2019-09-09 DIAGNOSIS — Z131 Encounter for screening for diabetes mellitus: Secondary | ICD-10-CM

## 2019-09-09 DIAGNOSIS — Z113 Encounter for screening for infections with a predominantly sexual mode of transmission: Secondary | ICD-10-CM

## 2019-09-09 DIAGNOSIS — Z3483 Encounter for supervision of other normal pregnancy, third trimester: Secondary | ICD-10-CM

## 2019-09-09 NOTE — Progress Notes (Signed)
Routine Prenatal Care Visit- Virtual Visit  Subjective   Virtual Visit via Video Note  I connected with Karen May on 09/09/19 at  2:50 PM EDT by telephone and verified that I am speaking with the correct person using two identifiers.   I discussed the limitations, risks, security and privacy concerns of performing an evaluation and management service by telephone and the availability of in person appointments. I also discussed with the patient that there may be a patient responsible charge related to this service. The patient expressed understanding and agreed to proceed.  The patient was at home I visited with the patient from my  Office computer The names of people involved in this encounter were: Karen May and myself [name] is a 24 y.o. G2P0010 at [redacted]w[redacted]d being seen today for ongoing prenatal care.  She is currently monitored for the following issues for this low-risk pregnancy and has Supervision of low-risk first pregnancy, unspecified trimester; Marijuana use; Vaginal bleeding; and Anxiety and depression on their problem list.  ----------------------------------------------------------------------------------- Patient reports no complaints.    . Vag. Bleeding: None.  Movement: Present. Denies leaking of fluid.  ----------------------------------------------------------------------------------- The following portions of the patient's history were reviewed and updated as appropriate: allergies, current medications, past family history, past medical history, past social history, past surgical history and problem list. Problem list updated.   Objective  Height 5\' 5"  (1.651 m), weight 154 lb (69.9 kg), last menstrual period 03/22/2019, unknown if currently breastfeeding. Pregravid weight 132 lb (59.9 kg) Total Weight Gain 22 lb (9.979 kg) Urinalysis:      Fetal Status:     Movement: Present     Physical Exam could not be performed. Because of the  COVID-19 outbreak this visit was performed over the phone and not in person.   Assessment   24 y.o. G2P0010 at [redacted]w[redacted]d by  01/01/2020, by Ultrasound presenting for routine prenatal visit  Plan   Pregnancy#2 Problems (from 03/23/19 to present)    Problem Noted Resolved   Supervision of low-risk first pregnancy, unspecified trimester 09/25/2018 by 09/27/2018, CNM No   Overview Addendum 08/12/2019  3:14 PM by 08/14/2019, MD    [facility] Prenatal Labs  Dating LMP, Nadara Mustard agrees Blood type: O/Positive/-- (12/17 1130)   Genetic Screen declines Antibody:Negative (12/17 1130)  Anatomic [birth year]1982 [facility] nml Rubella: 3.89 (12/17 1130) Varicella: Imm  GTT       Third trimester:  RPR: Non Reactive (12/17 1130)   Rhogam n/a HBsAg: Negative (12/17 1130)   TDaP vaccine                  Flu Shot: HIV: Non Reactive (12/17 1130)   Baby Food   Breast                     GBS:   Contraception   POP Pap:08/2018 nml, repeat PP  CBB  no   CS/VBAC n/a   Support Person               Gestational age appropriate obstetric precautions including but not limited to vaginal bleeding, contractions, leaking of fluid and fetal movement were reviewed in detail with the patient.     Follow Up Instructions: 28 week labs in 4 weeks Call for questions/concerns   I discussed the assessment and treatment plan with the patient. The patient was provided an opportunity to ask questions and all were answered. The patient agreed with  the plan and demonstrated an understanding of the instructions.   The patient was advised to call back or seek an in-person evaluation if the symptoms worsen or if the condition fails to improve as anticipated.  I provided 10 minutes of non-face-to-face time during this encounter.  Return in about 4 weeks (around 10/07/2019) for 28 wk labs and rob.  Rod Can, [facility] 09/09/2019, 3:15 PM

## 2019-10-07 ENCOUNTER — Other Ambulatory Visit: Payer: Self-pay

## 2019-10-07 ENCOUNTER — Ambulatory Visit (INDEPENDENT_AMBULATORY_CARE_PROVIDER_SITE_OTHER): Payer: 59 | Admitting: Obstetrics & Gynecology

## 2019-10-07 ENCOUNTER — Other Ambulatory Visit: Payer: 59

## 2019-10-07 ENCOUNTER — Encounter: Payer: Self-pay | Admitting: Obstetrics & Gynecology

## 2019-10-07 VITALS — BP 90/60 | Wt 167.0 lb

## 2019-10-07 DIAGNOSIS — Z131 Encounter for screening for diabetes mellitus: Secondary | ICD-10-CM

## 2019-10-07 DIAGNOSIS — Z3483 Encounter for supervision of other normal pregnancy, third trimester: Secondary | ICD-10-CM

## 2019-10-07 DIAGNOSIS — Z3A27 27 weeks gestation of pregnancy: Secondary | ICD-10-CM

## 2019-10-07 DIAGNOSIS — Z113 Encounter for screening for infections with a predominantly sexual mode of transmission: Secondary | ICD-10-CM

## 2019-10-07 DIAGNOSIS — Z130 Encounter for screening for diseases of the blood and blood-forming organs and certain disorders involving the immune mechanism: Secondary | ICD-10-CM

## 2019-10-07 LAB — POCT URINALYSIS DIPSTICK OB
Glucose, UA: NEGATIVE
POC,PROTEIN,UA: NEGATIVE

## 2019-10-07 NOTE — Progress Notes (Signed)
  Subjective  Fetal Movement? yes Contractions? no Leaking Fluid? no Vaginal Bleeding? no  Objective  BP 90/60   Wt 167 lb (75.8 kg)   LMP 03/22/2019 (Exact Date)   BMI 27.79 kg/m  General: NAD Pumonary: no increased work of breathing Abdomen: gravid, non-tender Extremities: no edema Psychiatric: mood appropriate, affect full  Assessment  24 y.o. G2P0010 at [redacted]w[redacted]d by  01/01/2020, by Ultrasound presenting for routine prenatal visit  Plan   Problem List Items Addressed This Visit    None    Visit Diagnoses    [redacted] weeks gestation of pregnancy    -  Primary   Encounter for supervision of other normal pregnancy in third trimester          Pregnancy#2 Problems (from 03/23/19 to present)    Problem Noted Resolved   Supervision of low-risk first pregnancy, unspecified trimester 09/25/2018 by Farrel Conners, CNM No   Overview Addendum 08/12/2019  3:14 PM by Nadara Mustard, MD    [facility] Prenatal Labs  Dating LMP, [place] agrees Blood type: O/Positive/-- (12/17 1130)   Genetic Screen declines Antibody:Negative (12/17 1130)  Anatomic [place] [facility] nml Rubella: 3.89 (12/17 1130) Varicella: Imm  GTT       Third trimester:  RPR: Non Reactive (12/17 1130)   Rhogam n/a HBsAg: Negative (12/17 1130)   TDaP vaccine                  Flu Shot: HIV: Non Reactive (12/17 1130)   Baby Food   Breast                     GBS:   Contraception   POP Pap:08/2018 nml, repeat PP  CBB  no   CS/VBAC n/a   Support Person          Glucola today PNV, [facility]  Breast feeding plans POP for PP contraception  Annamarie Major, MD, [facility], [facility] 10/07/2019  9:54 AM

## 2019-10-07 NOTE — Patient Instructions (Signed)
Third Trimester of Pregnancy The third trimester is from week 28 through week 40 (months 7 through 9). The third trimester is a time when the unborn baby (fetus) is growing rapidly. At the end of the ninth month, the fetus is about 20 inches in length and weighs 6-10 pounds. Body changes during your third trimester Your body will continue to go through many changes during pregnancy. The changes vary from woman to woman. During the third trimester:  Your weight will continue to increase. You can expect to gain 25-35 pounds (11-16 kg) by the end of the pregnancy.  You may begin to get stretch marks on your hips, abdomen, and breasts.  You may urinate more often because the fetus is moving lower into your pelvis and pressing on your bladder.  You may develop or continue to have heartburn. This is caused by increased hormones that slow down muscles in the digestive tract.  You may develop or continue to have constipation because increased hormones slow digestion and cause the muscles that push waste through your intestines to relax.  You may develop hemorrhoids. These are swollen veins (varicose veins) in the rectum that can itch or be painful.  You may develop swollen, bulging veins (varicose veins) in your legs.  You may have increased body aches in the pelvis, back, or thighs. This is due to weight gain and increased hormones that are relaxing your joints.  You may have changes in your hair. These can include thickening of your hair, rapid growth, and changes in texture. Some women also have hair loss during or after pregnancy, or hair that feels dry or thin. Your hair will most likely return to normal after your baby is born.  Your breasts will continue to grow and they will continue to become tender. A yellow fluid (colostrum) may leak from your breasts. This is the first milk you are producing for your baby.  Your belly button may stick out.  You may notice more swelling in your hands,  face, or ankles.  You may have increased tingling or numbness in your hands, arms, and legs. The skin on your belly may also feel numb.  You may feel short of breath because of your expanding uterus.  You may have more problems sleeping. This can be caused by the size of your belly, increased need to urinate, and an increase in your body's metabolism.  You may notice the fetus "dropping," or moving lower in your abdomen (lightening).  You may have increased vaginal discharge.  You may notice your joints feel loose and you may have pain around your pelvic bone. What to expect at prenatal visits You will have prenatal exams every 2 weeks until week 36. Then you will have weekly prenatal exams. During a routine prenatal visit:  You will be weighed to make sure you and the baby are growing normally.  Your blood pressure will be taken.  Your abdomen will be measured to track your baby's growth.  The fetal heartbeat will be listened to.  Any test results from the previous visit will be discussed.  You may have a cervical check near your due date to see if your cervix has softened or thinned (effaced).  You will be tested for Group B streptococcus. This happens between 35 and 37 weeks. Your health care provider may ask you:  What your birth plan is.  How you are feeling.  If you are feeling the baby move.  If you have had any abnormal   symptoms, such as leaking fluid, bleeding, severe headaches, or abdominal cramping.  If you are using any tobacco products, including cigarettes, chewing tobacco, and electronic cigarettes.  If you have any questions. Other tests or screenings that may be performed during your third trimester include:  Blood tests that check for low iron levels (anemia).  Fetal testing to check the health, activity level, and growth of the fetus. Testing is done if you have certain medical conditions or if there are problems during the pregnancy.  Nonstress test  (NST). This test checks the health of your baby to make sure there are no signs of problems, such as the baby not getting enough oxygen. During this test, a belt is placed around your belly. The baby is made to move, and its heart rate is monitored during movement. What is false labor? False labor is a condition in which you feel small, irregular tightenings of the muscles in the womb (contractions) that usually go away with rest, changing position, or drinking water. These are called Braxton Hicks contractions. Contractions may last for hours, days, or even weeks before true labor sets in. If contractions come at regular intervals, become more frequent, increase in intensity, or become painful, you should see your health care provider. What are the signs of labor?  Abdominal cramps.  Regular contractions that start at 10 minutes apart and become stronger and more frequent with time.  Contractions that start on the top of the uterus and spread down to the lower abdomen and back.  Increased pelvic pressure and dull back pain.  A watery or bloody mucus discharge that comes from the vagina.  Leaking of amniotic fluid. This is also known as your "water breaking." It could be a slow trickle or a gush. Let your health care provider know if it has a color or strange odor. If you have any of these signs, call your health care provider right away, even if it is before your due date. Follow these instructions at home: Medicines  Follow your health care provider's instructions regarding medicine use. Specific medicines may be either safe or unsafe to take during pregnancy.  Take a prenatal vitamin that contains at least 600 micrograms (mcg) of folic acid.  If you develop constipation, try taking a stool softener if your health care provider approves. Eating and drinking   Eat a balanced diet that includes fresh fruits and vegetables, whole grains, good sources of protein such as meat, eggs, or tofu,  and low-fat dairy. Your health care provider will help you determine the amount of weight gain that is right for you.  Avoid raw meat and uncooked cheese. These carry germs that can cause birth defects in the baby.  If you have low calcium intake from food, talk to your health care provider about whether you should take a daily calcium supplement.  Eat four or five small meals rather than three large meals a day.  Limit foods that are high in fat and processed sugars, such as fried and sweet foods.  To prevent constipation: ? Drink enough fluid to keep your urine clear or pale yellow. ? Eat foods that are high in fiber, such as fresh fruits and vegetables, whole grains, and beans. Activity  Exercise only as directed by your health care provider. Most women can continue their usual exercise routine during pregnancy. Try to exercise for 30 minutes at least 5 days a week. Stop exercising if you experience uterine contractions.  Avoid heavy lifting.  Do   not exercise in extreme heat or humidity, or at high altitudes.  Wear low-heel, comfortable shoes.  Practice good posture.  You may continue to have sex unless your health care provider tells you otherwise. Relieving pain and discomfort  Take frequent breaks and rest with your legs elevated if you have leg cramps or low back pain.  Take warm sitz baths to soothe any pain or discomfort caused by hemorrhoids. Use hemorrhoid cream if your health care provider approves.  Wear a good support bra to prevent discomfort from breast tenderness.  If you develop varicose veins: ? Wear support pantyhose or compression stockings as told by your healthcare provider. ? Elevate your feet for 15 minutes, 3-4 times a day. Prenatal care  Write down your questions. Take them to your prenatal visits.  Keep all your prenatal visits as told by your health care provider. This is important. Safety  Wear your seat belt at all times when driving.  Make  a list of emergency phone numbers, including numbers for family, friends, the hospital, and police and fire departments. General instructions  Avoid cat litter boxes and soil used by cats. These carry germs that can cause birth defects in the baby. If you have a cat, ask someone to clean the litter box for you.  Do not travel far distances unless it is absolutely necessary and only with the approval of your health care provider.  Do not use hot tubs, steam rooms, or saunas.  Do not drink alcohol.  Do not use any products that contain nicotine or tobacco, such as cigarettes and e-cigarettes. If you need help quitting, ask your health care provider.  Do not use any medicinal herbs or unprescribed drugs. These chemicals affect the formation and growth of the baby.  Do not douche or use tampons or scented sanitary pads.  Do not cross your legs for long periods of time.  To prepare for the arrival of your baby: ? Take prenatal classes to understand, practice, and ask questions about labor and delivery. ? Make a trial run to the hospital. ? Visit the hospital and tour the maternity area. ? Arrange for maternity or paternity leave through employers. ? Arrange for family and friends to take care of pets while you are in the hospital. ? Purchase a rear-facing car seat and make sure you know how to install it in your car. ? Pack your hospital bag. ? Prepare the baby's nursery. Make sure to remove all pillows and stuffed animals from the baby's crib to prevent suffocation.  Visit your dentist if you have not gone during your pregnancy. Use a soft toothbrush to brush your teeth and be gentle when you floss. Contact a health care provider if:  You are unsure if you are in labor or if your water has broken.  You become dizzy.  You have mild pelvic cramps, pelvic pressure, or nagging pain in your abdominal area.  You have lower back pain.  You have persistent nausea, vomiting, or  diarrhea.  You have an unusual or bad smelling vaginal discharge.  You have pain when you urinate. Get help right away if:  Your water breaks before 37 weeks.  You have regular contractions less than 5 minutes apart before 37 weeks.  You have a fever.  You are leaking fluid from your vagina.  You have spotting or bleeding from your vagina.  You have severe abdominal pain or cramping.  You have rapid weight loss or weight gain.  You have   shortness of breath with chest pain.  You notice sudden or extreme swelling of your face, hands, ankles, feet, or legs.  Your baby makes fewer than 10 movements in 2 hours.  You have severe headaches that do not go away when you take medicine.  You have vision changes. Summary  The third trimester is from week 28 through week 40, months 7 through 9. The third trimester is a time when the unborn baby (fetus) is growing rapidly.  During the third trimester, your discomfort may increase as you and your baby continue to gain weight. You may have abdominal, leg, and back pain, sleeping problems, and an increased need to urinate.  During the third trimester your breasts will keep growing and they will continue to become tender. A yellow fluid (colostrum) may leak from your breasts. This is the first milk you are producing for your baby.  False labor is a condition in which you feel small, irregular tightenings of the muscles in the womb (contractions) that eventually go away. These are called Braxton Hicks contractions. Contractions may last for hours, days, or even weeks before true labor sets in.  Signs of labor can include: abdominal cramps; regular contractions that start at 10 minutes apart and become stronger and more frequent with time; watery or bloody mucus discharge that comes from the vagina; increased pelvic pressure and dull back pain; and leaking of amniotic fluid. This information is not intended to replace advice given to you by your  health care provider. Make sure you discuss any questions you have with your health care provider. Document Revised: 09/05/2018 Document Reviewed: 06/20/2016 Elsevier Patient Education  2020 [employer]  

## 2019-10-07 NOTE — Addendum Note (Signed)
Addended by: Cornelius Moras D on: 10/07/2019 10:20 AM   Modules accepted: Orders

## 2019-10-08 LAB — 28 WEEK RH+PANEL
Basophils Absolute: 0 10*3/uL (ref 0.0–0.2)
Basos: 0 %
EOS (ABSOLUTE): 0.1 10*3/uL (ref 0.0–0.4)
Eos: 1 %
Gestational Diabetes Screen: 105 mg/dL (ref 65–139)
HIV Screen 4th Generation wRfx: NONREACTIVE
Hematocrit: 40.7 % (ref 34.0–46.6)
Hemoglobin: 13.4 g/dL (ref 11.1–15.9)
Immature Grans (Abs): 0.1 10*3/uL (ref 0.0–0.1)
Immature Granulocytes: 1 %
Lymphocytes Absolute: 1.4 10*3/uL (ref 0.7–3.1)
Lymphs: 14 %
MCH: 28.5 pg (ref 26.6–33.0)
MCHC: 32.9 g/dL (ref 31.5–35.7)
MCV: 87 fL (ref 79–97)
Monocytes Absolute: 0.8 10*3/uL (ref 0.1–0.9)
Monocytes: 8 %
Neutrophils Absolute: 7.7 10*3/uL — ABNORMAL HIGH (ref 1.4–7.0)
Neutrophils: 76 %
Platelets: 185 10*3/uL (ref 150–450)
RBC: 4.7 x10E6/uL (ref 3.77–5.28)
RDW: 17.5 % — ABNORMAL HIGH (ref 11.7–15.4)
RPR Ser Ql: NONREACTIVE
WBC: 10 10*3/uL (ref 3.4–10.8)
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
## 2019-10-28 ENCOUNTER — Other Ambulatory Visit: Payer: Self-pay

## 2019-10-28 ENCOUNTER — Ambulatory Visit (INDEPENDENT_AMBULATORY_CARE_PROVIDER_SITE_OTHER): Payer: 59 | Admitting: Certified Nurse Midwife

## 2019-10-28 VITALS — BP 94/64 | Wt 171.4 lb

## 2019-10-28 DIAGNOSIS — Z3493 Encounter for supervision of normal pregnancy, unspecified, third trimester: Secondary | ICD-10-CM

## 2019-10-28 DIAGNOSIS — Z3A30 30 weeks gestation of pregnancy: Secondary | ICD-10-CM

## 2019-10-28 LAB — POCT URINALYSIS DIPSTICK OB: Glucose, UA: NEGATIVE

## 2019-10-30 NOTE — Progress Notes (Signed)
ROB at 30wk5d: Doing well. Baby active. Some BH contractions, no vaginal bleeding or LOF.Will be breast feeding. 28 week labs WNL  FH 30 cm/ FHT 144  A: IUP at 30 weeks with S=D  P: Discussed online childbirth classes and breast feeding classes Preeclampsia and preterm labor precautions Declined TDAP at this visit-discussed importance in pregnancy-will reconsider and may get at next appointment ROB in 2 weeks Farrel Conners, CNM

## 2019-11-11 ENCOUNTER — Other Ambulatory Visit: Payer: Self-pay

## 2019-11-11 ENCOUNTER — Ambulatory Visit (INDEPENDENT_AMBULATORY_CARE_PROVIDER_SITE_OTHER): Payer: 59 | Admitting: Certified Nurse Midwife

## 2019-11-11 VITALS — BP 102/56 | Wt 175.0 lb

## 2019-11-11 DIAGNOSIS — Z3400 Encounter for supervision of normal first pregnancy, unspecified trimester: Secondary | ICD-10-CM

## 2019-11-11 DIAGNOSIS — Z3403 Encounter for supervision of normal first pregnancy, third trimester: Secondary | ICD-10-CM

## 2019-11-11 DIAGNOSIS — Z3A32 32 weeks gestation of pregnancy: Secondary | ICD-10-CM

## 2019-11-11 DIAGNOSIS — Z23 Encounter for immunization: Secondary | ICD-10-CM

## 2019-11-11 LAB — POCT URINALYSIS DIPSTICK OB: Glucose, UA: NEGATIVE

## 2019-11-11 NOTE — Progress Notes (Signed)
ROB TDAP given 

## 2019-11-11 NOTE — Progress Notes (Signed)
ROB at 32wk5d: Doing well. Baby active. No dysuria. No vaginal bleeding or leakage of fluid. Decided to get TDAP today.  FH 33cm FHT 140 BP 102/56 TWG 43# (current weight 175#)  A: IUP at 32wk5d S=D  P: ROB in 2 weeks TDAP today Breast/ POP  Farrel Conners, CNM

## 2019-11-26 ENCOUNTER — Other Ambulatory Visit: Payer: Self-pay

## 2019-11-26 ENCOUNTER — Ambulatory Visit (INDEPENDENT_AMBULATORY_CARE_PROVIDER_SITE_OTHER): Payer: 59 | Admitting: Obstetrics

## 2019-11-26 VITALS — BP 100/60 | Wt 178.0 lb

## 2019-11-26 DIAGNOSIS — Z3403 Encounter for supervision of normal first pregnancy, third trimester: Secondary | ICD-10-CM

## 2019-11-26 DIAGNOSIS — Z3A34 34 weeks gestation of pregnancy: Secondary | ICD-10-CM

## 2019-11-26 DIAGNOSIS — Z3400 Encounter for supervision of normal first pregnancy, unspecified trimester: Secondary | ICD-10-CM

## 2019-11-26 LAB — POCT URINALYSIS DIPSTICK OB
Glucose, UA: NEGATIVE
POC,PROTEIN,UA: NEGATIVE

## 2019-11-26 NOTE — Progress Notes (Signed)
ROB- no concerns 

## 2019-11-26 NOTE — Progress Notes (Signed)
Here  At 34 weeks for ROB visit. Doing well. Denies any danger sxs. Ample fetal movement, denies any vaginal bleeding, LOF O: See chart above A: IUP 34 weeks S=D P RTC in 2 weeks for GBS testing, ROB     Discussed contraceptive options, including IUDs, OCPs, Nuva Ring.     She is leaning toward POPs to start, but may consider a LARC.     Mirna Mires, CNM  11/26/2019 10:44 AM

## 2019-12-03 ENCOUNTER — Other Ambulatory Visit: Payer: Self-pay

## 2019-12-03 ENCOUNTER — Other Ambulatory Visit: Payer: Self-pay | Admitting: Obstetrics

## 2019-12-03 ENCOUNTER — Ambulatory Visit (INDEPENDENT_AMBULATORY_CARE_PROVIDER_SITE_OTHER): Payer: 59 | Admitting: Obstetrics

## 2019-12-03 ENCOUNTER — Telehealth: Payer: Self-pay | Admitting: Obstetrics

## 2019-12-03 VITALS — BP 115/74 | Ht 65.0 in | Wt 174.6 lb

## 2019-12-03 DIAGNOSIS — O2240 Hemorrhoids in pregnancy, unspecified trimester: Secondary | ICD-10-CM | POA: Insufficient documentation

## 2019-12-03 DIAGNOSIS — Z3400 Encounter for supervision of normal first pregnancy, unspecified trimester: Secondary | ICD-10-CM

## 2019-12-03 DIAGNOSIS — Z3A35 35 weeks gestation of pregnancy: Secondary | ICD-10-CM

## 2019-12-03 DIAGNOSIS — O2243 Hemorrhoids in pregnancy, third trimester: Secondary | ICD-10-CM

## 2019-12-03 LAB — POCT URINALYSIS DIPSTICK OB
Glucose, UA: NEGATIVE
POC,PROTEIN,UA: NEGATIVE

## 2019-12-03 LAB — OB RESULTS CONSOLE GBS: GBS: NEGATIVE

## 2019-12-03 MED ORDER — HYDROCORT-PRAMOXINE (PERIANAL) 1-1 % EX FOAM: 1.0000 | Freq: Two times a day (BID) | CUTANEOUS | Status: AC

## 2019-12-03 MED ORDER — HYDROCORTISONE (PERIANAL) 1 % EX CREA: 1.0000 | TOPICAL_CREAM | Freq: Two times a day (BID) | CUTANEOUS | 0 refills | Status: DC

## 2019-12-03 NOTE — Patient Instructions (Signed)
Second Stage of Labor Labor is your body's natural process of moving your baby and other structures, including the placenta and umbilical cord, out of your uterus. There are three stages of labor. How long each stage lasts is different for every woman. But certain events happen during each stage that are the same for everyone.  The first stage starts when true labor begins. This stage ends when your cervix, which is the opening from your uterus into your vagina, is completely open (dilated).  The second stage begins when your cervix is fully dilated and you start pushing. This stage ends when your baby is born.  The third stage is the delivery of the organ that nourished your baby during pregnancy (placenta). The second stage of labor involves lots of pushing until your baby is delivered. This stage usually lasts from 20 minutes to 2 hours. How does this affect me? What happens in the second stage of labor?  You will have strong contractions. They may last 45-90 seconds and come every 2-5 minutes.  You may need to use your pain management strategies.  You will feel a strong urge to push. Try to push during the contractions and relax between contractions.  You can try to find a pushing position that works best for you. This may be lying back, squatting, kneeling, or sitting.  You may feel a lot of pressure in your rectum and feel like you need to have a bowel movement.  You may pass some stool or leak some urine.  You may feel mentally exhausted and emotional. Rely on your birth support people for support and encouragement.  Eventually, the top of your baby's head will be visible (crowning). You may be able to see this in a mirror.  You may have a small tear in the tissues between your vagina and rectum (perineal tear) that occurs during labor and delivery. If this occurs, it will usually not be noticeable while you are pushing, and it will be repaired right after delivery.  If your baby  is in a normal position, your birth care provider will help guide your baby out of your vagina. What happens if this stage of labor is not progressing? If your tissues do not stretch enough or if your baby is not making enough progress through the birth canal, certain procedures may be done to help deliver the baby.  Your birth care provider may cut the tissue between your vagina and rectum to widen the opening (episiotomy).  Your birth care provider may use a forceps or suctioning device to help pull your baby through the birth canal (assisted birth).  Your birth care provider may recommend a cesarean delivery, or C-section. This is the surgical delivery of a baby through an incision in the abdomen and the uterus. How does this affect my baby?  As the baby moves through the birth canal, his or her head will mold to fit. Your baby may be born with a cone-shaped head that will look normal within 24 hours.  Moving through the birth canal helps to squeeze fluid out of your baby's lungs. This fluid is present because of practice breathing movements that the baby made while in your uterus.  Your baby may have bruising or swelling on his or her face from passage through the birth canal.  In rare cases, more serious birth injuries may occur. Babies who are born with the assistance of forceps or a suctioning device may be at higher risk for an injury.  Summary  The second stage of labor starts when your cervix is completely dilated, and it ends when your baby is born.  The second stage involves lots of pushing until your baby is delivered. This stage usually lasts from 20 minutes to 2 hours.  You may need to use your pain management strategies.  During this stage, you will experience a strong urge to push, the crowning of your baby's head, and the delivery of your baby.  If this stage is not progressing normally, you may need to have an episiotomy, an assisted delivery, or a cesarean  delivery. This information is not intended to replace advice given to you by your health care provider. Make sure you discuss any questions you have with your health care provider. Document Revised: 09/06/2018 Document Reviewed: 07/30/2017 Elsevier Patient Education  [place] is your body's natural process of moving your baby and other structures, including the placenta and umbilical cord, out of your uterus. There are three stages of labor. How long each stage lasts is different for every woman. But certain events happen during each stage that are the same for everyone.  The first stage starts when true labor begins. This stage ends when your cervix, which is the opening from your uterus into your vagina, is completely open (dilated).  The second stage begins when your cervix is fully dilated and you start pushing. This stage ends when your baby is born.  The third stage is the delivery of the organ that nourished your baby during pregnancy (placenta). First stage of labor As your due date gets closer, you may start to notice certain physical changes that mean labor is going to start soon. You may feel that your baby has dropped lower into your pelvis. You may experience irregular, often painless, contractions that go away when you walk around or lie down ([employer] contractions). This is also called false labor. The first stage of labor begins when you start having contractions that come at regular (evenly spaced) intervals and your cervix starts to get thinner and wider in preparation for your baby to pass through. Birth care providers measure the dilation of your cervix in centimeters (cm). One centimeter is a little less than one-half of an inch. The first stage ends when your cervix is dilated to 10 cm. The first stage of labor is divided into three phases:  Early phase.  Active phase.  Transitional phase. The length of the first stage of labor  varies. It may be longer if this is your first pregnancy. You may spend most of this stage at home trying to relax and stay comfortable. How does this affect me? During the first stage of labor, you will move through three phases. What happens in the early phase?  You will start to have regular contractions that last 30-60 seconds. Contractions may come every 5-20 minutes. Keep track of your contractions and call your birth care provider.  Your water may break during this phase.  You may notice a clear or slightly bloody discharge of mucus (mucus plug) from your vagina.  Your cervix will dilate to 3-6 cm. What happens in the active phase? The active phase usually lasts 3-5 hours. You may go to the hospital or birth center around this time. During the active phase:  Your contractions will become stronger, longer, and more uncomfortable.  Your contractions may last 45-90 seconds and come every 3-5 minutes.  You may feel lower back pain.  Your birth care providers may examine your cervix and feel your belly to find the position of your baby.  You may have a monitor strapped to your belly to measure your contractions and your baby's heart rate.  You may start using your pain management options.  Your cervix may be dilated to 6 cm and may start to dilate more quickly. What happens in the transitional phase? The transitional phase typically lasts from 30 minutes to 2 hours. At the end of this phase, your cervix will be fully dilated to 10 cm. During the transitional phase:  Contractions will get stronger and longer.  Contractions may last 60-90 seconds and come less than 2 minutes apart.  You may feel hot flashes, chills, or nausea. How does this affect my baby? During the first stage of labor, your baby will gradually move down into your birth canal. Follow these instructions at home and in the hospital or birth center:   When labor first begins, try to stay calm. You are still in  the early phase. If it is night, try to get some sleep. If it is day, try to relax and save your energy. You may want to make some calls and get ready to go to the hospital or birth center.  When you are in the early phase, try these methods to help ease discomfort: ? Deep breathing and muscle relaxation. ? Taking a walk. ? Taking a warm bath or shower.  Drink some fluids and have a light snack if you feel like it.  Keep track of your contractions.  Based on the plan you created with your birth care provider, call when your contractions indicate it is time.  If your water breaks, note the time, color, and odor of the fluid.  When you are in the active phase, do your breathing exercises and rely on your support people and your team of birth care providers. Contact a health care provider if:  Your contractions are strong and regular.  You have lower back pain or cramping.  Your water breaks.  You lose your mucus plug. Get help right away if you:  Have a severe headache that does not go away.  Have changes in your vision.  Have severe pain in your upper belly.  Do not feel the baby move.  Have bright red bleeding. Summary  The first stage of labor starts when true labor begins, and it ends when your cervix is dilated to 10 cm.  The first stage of labor has three phases: early, active, and transitional.  Your baby moves into the birth canal during the first stage of labor.  You may have contractions that become stronger and longer. You may also lose your mucus plug and have your water break.  Call your birth care provider when your contractions are frequent and strong enough to go to the hospital or birth center. This information is not intended to replace advice given to you by your health care provider. Make sure you discuss any questions you have with your health care provider. Document Revised: 09/05/2018 Document Reviewed: 07/29/2017 Elsevier Patient Education  2020  [employer] Vaginal Delivery  Vaginal delivery means that you give birth by pushing your baby out of your birth canal (vagina). A team of health care providers will help you before, during, and after vaginal delivery. Birth experiences are unique for every woman and every pregnancy, and birth experiences vary depending on where you choose to give birth. What happens when I  arrive at the birth center or hospital? Once you are in labor and have been admitted into the hospital or birth center, your health care provider may:  Review your pregnancy history and any concerns that you have.  Insert an IV into one of your veins. This may be used to give you fluids and medicines.  Check your blood pressure, pulse, temperature, and heart rate (vital signs).  Check whether your bag of water (amniotic sac) has broken (ruptured).  Talk with you about your birth plan and discuss pain control options. Monitoring Your health care provider may monitor your contractions (uterine monitoring) and your baby's heart rate (fetal monitoring). You may need to be monitored:  Often, but not continuously (intermittently).  All the time or for long periods at a time (continuously). Continuous monitoring may be needed if: ? You are taking certain medicines, such as medicine to relieve pain or make your contractions stronger. ? You have pregnancy or labor complications. Monitoring may be done by:  Placing a special stethoscope or a handheld monitoring device on your abdomen to check your baby's heartbeat and to check for contractions.  Placing monitors on your abdomen (external monitors) to record your baby's heartbeat and the frequency and length of contractions.  Placing monitors inside your uterus through your vagina (internal monitors) to record your baby's heartbeat and the frequency, length, and strength of your contractions. Depending on the type of monitor, it may remain in your uterus or on your baby's head  until birth.  Telemetry. This is a type of continuous monitoring that can be done with external or internal monitors. Instead of having to stay in bed, you are able to move around during telemetry. Physical exam Your health care provider may perform frequent physical exams. This may include:  Checking how and where your baby is positioned in your uterus.  Checking your cervix to determine: ? Whether it is thinning out (effacing). ? Whether it is opening up (dilating). What happens during labor and delivery?  Normal labor and delivery is divided into the following three stages: Stage 1  This is the longest stage of labor.  This stage can last for hours or days.  Throughout this stage, you will feel contractions. Contractions generally feel mild, infrequent, and irregular at first. They get stronger, more frequent (about every 2-3 minutes), and more regular as you move through this stage.  This stage ends when your cervix is completely dilated to 4 inches (10 cm) and completely effaced. Stage 2  This stage starts once your cervix is completely effaced and dilated and lasts until the delivery of your baby.  This stage may last from 20 minutes to 2 hours.  This is the stage where you will feel an urge to push your baby out of your vagina.  You may feel stretching and burning pain, especially when the widest part of your baby's head passes through the vaginal opening (crowning).  Once your baby is delivered, the umbilical cord will be clamped and cut. This usually occurs after waiting a period of 1-2 minutes after delivery.  Your baby will be placed on your bare chest (skin-to-skin contact) in an upright position and covered with a warm blanket. Watch your baby for feeding cues, like rooting or sucking, and help the baby to your breast for his or her first feeding. Stage 3  This stage starts immediately after the birth of your baby and ends after you deliver the placenta.  This  stage may take  anywhere from 5 to 30 minutes.  After your baby has been delivered, you will feel contractions as your body expels the placenta and your uterus contracts to control bleeding. What can I expect after labor and delivery?  After labor is over, you and your baby will be monitored closely until you are ready to go home to ensure that you are both healthy. Your health care team will teach you how to care for yourself and your baby.  You and your baby will stay in the same room (rooming in) during your hospital stay. This will encourage early bonding and successful breastfeeding.  You may continue to receive fluids and medicines through an IV.  Your uterus will be checked and massaged regularly (fundal massage).  You will have some soreness and pain in your abdomen, vagina, and the area of skin between your vaginal opening and your anus (perineum).  If an incision was made near your vagina (episiotomy) or if you had some vaginal tearing during delivery, cold compresses may be placed on your episiotomy or your tear. This helps to reduce pain and swelling.  You may be given a squirt bottle to use instead of wiping when you go to the bathroom. To use the squirt bottle, follow these steps: ? Before you urinate, fill the squirt bottle with warm water. Do not use hot water. ? After you urinate, while you are sitting on the toilet, use the squirt bottle to rinse the area around your urethra and vaginal opening. This rinses away any urine and blood. ? Fill the squirt bottle with clean water every time you use the bathroom.  It is normal to have vaginal bleeding after delivery. Wear a sanitary pad for vaginal bleeding and discharge. Summary  Vaginal delivery means that you will give birth by pushing your baby out of your birth canal (vagina).  Your health care provider may monitor your contractions (uterine monitoring) and your baby's heart rate (fetal monitoring).  Your health care  provider may perform a physical exam.  Normal labor and delivery is divided into three stages.  After labor is over, you and your baby will be monitored closely until you are ready to go home. This information is not intended to replace advice given to you by your health care provider. Make sure you discuss any questions you have with your health care provider. Document Revised: 06/19/2017 Document Reviewed: 06/19/2017 Elsevier Patient Education  2020 [employer]

## 2019-12-03 NOTE — Telephone Encounter (Signed)
Patient was seen in office for visit. Patient has went to pharmacy to pick up her prescriptions from today visit and they weren't there. Please advise

## 2019-12-03 NOTE — Progress Notes (Signed)
IUP 35 weeks 6 days. Went to urgent care last night with three large hemorrhoids. Given Lidocaine cream Very uncomfortable.  She denies any danger sxs obstetrically. Ample fetal movement,no ucs. A: IUP 35 + weeks Hemorrhoids For GBs culture today P: Rx for Proctofoam sent with careful instructions. Advised to use a "donut ring" for sitting, Dolace to decrease straining with bathroom use. PO Tylenol PRN. GBS cx retrieved. RTC in 1 week for ROB. Mirna Mires, CNM  12/03/2019 8:31 AM

## 2019-12-05 LAB — CULTURE, BETA STREP (GROUP B ONLY)

## 2019-12-10 ENCOUNTER — Ambulatory Visit (INDEPENDENT_AMBULATORY_CARE_PROVIDER_SITE_OTHER): Payer: 59 | Admitting: Obstetrics

## 2019-12-10 ENCOUNTER — Other Ambulatory Visit: Payer: Self-pay

## 2019-12-10 VITALS — BP 118/74 | Wt 176.0 lb

## 2019-12-10 DIAGNOSIS — Z3400 Encounter for supervision of normal first pregnancy, unspecified trimester: Secondary | ICD-10-CM

## 2019-12-10 DIAGNOSIS — Z3A36 36 weeks gestation of pregnancy: Secondary | ICD-10-CM

## 2019-12-10 NOTE — Progress Notes (Signed)
  Routine Prenatal Care Visit  Subjective  Karen May is a 24 y.o. G2P0010 at [redacted]w[redacted]d being seen today for ongoing prenatal care.  She is currently monitored for the following issues for this low-risk pregnancy and has Supervision of low-risk first pregnancy, unspecified trimester; Marijuana use; Vaginal bleeding; Anxiety and depression; and Hemorrhoids during pregnancy on their problem list.  ----------------------------------------------------------------------------------- Patient reports no complaints.   Contractions: Not present. Vag. Bleeding: None.  Movement: Present. Leaking Fluid denies.  ----------------------------------------------------------------------------------- The following portions of the patient's history were reviewed and updated as appropriate: allergies, current medications, past family history, past medical history, past social history, past surgical history and problem list. Problem list updated.  Objective  Blood pressure 118/74, weight 176 lb (79.8 kg), last menstrual period 03/22/2019, unknown if currently breastfeeding. Pregravid weight 132 lb (59.9 kg) Total Weight Gain 44 lb (20 kg) Urinalysis: Urine Protein    Urine Glucose    Fetal Status:     Movement: Present     General:  Alert, oriented and cooperative. Patient is in no acute distress.  Skin: Skin is warm and dry. No rash noted.   Cardiovascular: Normal heart rate noted  Respiratory: Normal respiratory effort, no problems with respiration noted  Abdomen: Soft, gravid, appropriate for gestational age. Pain/Pressure: Absent     Pelvic:  Cervical exam deferred        Extremities: Normal range of motion.     Mental Status: Normal mood and affect. Normal behavior. Normal judgment and thought content.   Assessment   24 y.o. G2P0010 at [redacted]w[redacted]d by  01/01/2020, by Ultrasound presenting for routine prenatal visit  Plan   Pregnancy#2 Problems (from 03/23/19 to present)    Problem Noted Resolved    Supervision of low-risk first pregnancy, unspecified trimester 09/25/2018 by Farrel Conners, CNM No   Overview Addendum 12/05/2019  7:07 PM by Mirna Mires, CNM    [facility]inic [facility] Prenatal Labs  Dating LMP, [place] agrees Blood type: O/Positive/-- (12/17 1130)   Genetic Screen declines Antibody:Negative (12/17 1130)  Anatomic [place] [facility] nml Rubella: 3.89 (12/17 1130) Varicella: Imm  GTT       Third trimester: 105 RPR: Non Reactive (12/17 1130)   Rhogam n/a GBS negative  TDaP vaccine       10/1518           Flu Shot:   Baby Food   Breast                       Contraception   POP Pap:08/2018 nml, repeat PP  CBB  no   CS/VBAC n/a   Support Person           Previous Version       Term labor symptoms and general obstetric precautions including but not limited to vaginal bleeding, contractions, leaking of fluid and fetal movement were reviewed in detail with the patient. Please refer to After Visit Summary for other counseling recommendations.   Return in about 1 week (around 12/17/2019) for return OB.  Mirna Mires, CNM  12/10/2019 2:30 PM

## 2019-12-10 NOTE — Progress Notes (Signed)
No vb. No lof.  

## 2019-12-17 ENCOUNTER — Other Ambulatory Visit: Payer: Self-pay

## 2019-12-17 ENCOUNTER — Ambulatory Visit (INDEPENDENT_AMBULATORY_CARE_PROVIDER_SITE_OTHER): Payer: 59 | Admitting: Obstetrics

## 2019-12-17 VITALS — BP 122/78 | Wt 178.0 lb

## 2019-12-17 DIAGNOSIS — Z3400 Encounter for supervision of normal first pregnancy, unspecified trimester: Secondary | ICD-10-CM

## 2019-12-17 DIAGNOSIS — Z3A37 37 weeks gestation of pregnancy: Secondary | ICD-10-CM

## 2019-12-17 NOTE — Progress Notes (Signed)
  Routine Prenatal Care Visit  Subjective  Karen May is a 24 y.o. G2P0010 at [redacted]w[redacted]d being seen today for ongoing prenatal care.  She is currently monitored for the following issues for this low-risk pregnancy and has Supervision of low-risk first pregnancy, unspecified trimester; Marijuana use; Vaginal bleeding; Anxiety and depression; and Hemorrhoids during pregnancy on their problem list.  ----------------------------------------------------------------------------------- Patient reports no complaints.   Contractions: Not present. Vag. Bleeding: None.  Movement: Present. Leaking Fluid denies.  ----------------------------------------------------------------------------------- The following portions of the patient's history were reviewed and updated as appropriate: allergies, current medications, past family history, past medical history, past social history, past surgical history and problem list. Problem list updated.  Objective  Blood pressure 122/78, weight 178 lb (80.7 kg), last menstrual period 03/22/2019, unknown if currently breastfeeding. Pregravid weight 132 lb (59.9 kg) Total Weight Gain 46 lb (20.9 kg) Urinalysis: Urine Protein    Urine Glucose    Fetal Status:     Movement: Present     General:  Alert, oriented and cooperative. Patient is in no acute distress.  Skin: Skin is warm and dry. No rash noted.   Cardiovascular: Normal heart rate noted  Respiratory: Normal respiratory effort, no problems with respiration noted  Abdomen: Soft, gravid, appropriate for gestational age. Pain/Pressure: Present     Pelvic:  Cervical exam deferred        Extremities: Normal range of motion.     Mental Status: Normal mood and affect. Normal behavior. Normal judgment and thought content.   Assessment   24 y.o. G2P0010 at [redacted]w[redacted]d by  01/01/2020, by Ultrasound presenting for routine prenatal visit  Plan   Pregnancy#2 Problems (from 03/23/19 to present)    Problem Noted Resolved    Supervision of low-risk first pregnancy, unspecified trimester 09/25/2018 by Farrel Conners, CNM No   Overview Addendum 12/05/2019  7:07 PM by Mirna Mires, CNM    Clinic [facility] Prenatal Labs  Dating LMP, [place] agrees Blood type: O/Positive/-- (12/17 1130)   Genetic Screen declines Antibody:Negative (12/17 1130)  Anatomic [place] [facility] nml Rubella: 3.89 (12/17 1130) Varicella: Imm  GTT       Third trimester: 105 RPR: Non Reactive (12/17 1130)   Rhogam n/a GBS negative  TDaP vaccine       10/1518           Flu Shot:   Baby Food   Breast                       Contraception   POP Pap:08/2018 nml, repeat PP  CBB  no   CS/VBAC n/a   Support Person           Previous Version       Term labor symptoms and general obstetric precautions including but not limited to vaginal bleeding, contractions, leaking of fluid and fetal movement were reviewed in detail with the patient. Please refer to After Visit Summary for other counseling recommendations.   Return in about 1 week (around 12/24/2019) for return OB.  Mirna Mires, CNM  12/17/2019 4:48 PM

## 2019-12-17 NOTE — Progress Notes (Signed)
No vb. No lof.  

## 2019-12-19 ENCOUNTER — Telehealth: Payer: Self-pay

## 2019-12-24 ENCOUNTER — Ambulatory Visit (INDEPENDENT_AMBULATORY_CARE_PROVIDER_SITE_OTHER): Payer: 59 | Admitting: Certified Nurse Midwife

## 2019-12-24 ENCOUNTER — Other Ambulatory Visit: Payer: Self-pay

## 2019-12-24 VITALS — BP 120/50 | Wt 182.0 lb

## 2019-12-24 DIAGNOSIS — Z3400 Encounter for supervision of normal first pregnancy, unspecified trimester: Secondary | ICD-10-CM

## 2019-12-24 DIAGNOSIS — Z3A38 38 weeks gestation of pregnancy: Secondary | ICD-10-CM

## 2019-12-24 DIAGNOSIS — Z3403 Encounter for supervision of normal first pregnancy, third trimester: Secondary | ICD-10-CM

## 2019-12-24 LAB — POCT URINALYSIS DIPSTICK OB
Glucose, UA: NEGATIVE
POC,PROTEIN,UA: NEGATIVE

## 2019-12-24 NOTE — Progress Notes (Signed)
ROB at 38wk6d: Doing well. Baby active. Irregular contractions. No vaginal bleeding or leakage of water. Increased pelvic pressure  FH 34 cm. FHT 140. BP 120/50 Weight 182# (50# TWG). +1 pedal edema Cervix: closed/70%/-1/medium/posterior  A: IUP at 38wk6d  P: Labor precautions ROB in 1 week  Farrel Conners, CNM

## 2019-12-24 NOTE — Progress Notes (Signed)
C/o both B-H and true ctxs; cx ck

## 2019-12-28 ENCOUNTER — Other Ambulatory Visit: Payer: Self-pay

## 2019-12-28 ENCOUNTER — Encounter: Payer: Self-pay | Admitting: Obstetrics & Gynecology

## 2019-12-28 ENCOUNTER — Inpatient Hospital Stay
Admission: EM | Admit: 2019-12-28 | Discharge: 2019-12-31 | DRG: 807 | Disposition: A | Discharge status: Home / Self Care | Payer: 59 | Attending: Advanced Practice Midwife | Admitting: Advanced Practice Midwife

## 2019-12-28 DIAGNOSIS — Z3400 Encounter for supervision of normal first pregnancy, unspecified trimester: Secondary | ICD-10-CM

## 2019-12-28 DIAGNOSIS — Z3A39 39 weeks gestation of pregnancy: Secondary | ICD-10-CM | POA: Diagnosis not present

## 2019-12-28 DIAGNOSIS — O26899 Other specified pregnancy related conditions, unspecified trimester: Secondary | ICD-10-CM | POA: Diagnosis present

## 2019-12-28 DIAGNOSIS — R1032 Left lower quadrant pain: Secondary | ICD-10-CM | POA: Diagnosis present

## 2019-12-28 DIAGNOSIS — Z20822 Contact with and (suspected) exposure to covid-19: Secondary | ICD-10-CM | POA: Diagnosis present

## 2019-12-28 DIAGNOSIS — O26893 Other specified pregnancy related conditions, third trimester: Secondary | ICD-10-CM | POA: Diagnosis present

## 2019-12-28 DIAGNOSIS — R1030 Lower abdominal pain, unspecified: Secondary | ICD-10-CM | POA: Diagnosis present

## 2019-12-28 DIAGNOSIS — O479 False labor, unspecified: Secondary | ICD-10-CM | POA: Diagnosis present

## 2019-12-28 DIAGNOSIS — Z3403 Encounter for supervision of normal first pregnancy, third trimester: Secondary | ICD-10-CM | POA: Diagnosis not present

## 2019-12-28 DIAGNOSIS — O4700 False labor before 37 completed weeks of gestation, unspecified trimester: Secondary | ICD-10-CM | POA: Diagnosis present

## 2019-12-28 LAB — CBC
HCT: 43.3 % (ref 36.0–46.0)
Hemoglobin: 14.6 g/dL (ref 12.0–15.0)
MCH: 30.4 pg (ref 26.0–34.0)
MCHC: 33.7 g/dL (ref 30.0–36.0)
MCV: 90 fL (ref 80.0–100.0)
Platelets: 144 10*3/uL — ABNORMAL LOW (ref 150–400)
RBC: 4.81 MIL/uL (ref 3.87–5.11)
RDW: 15.4 % (ref 11.5–15.5)
WBC: 11.3 10*3/uL — ABNORMAL HIGH (ref 4.0–10.5)
nRBC: 0 % (ref 0.0–0.2)

## 2019-12-28 LAB — URINALYSIS, COMPLETE (UACMP) WITH MICROSCOPIC
Bacteria, UA: NONE SEEN
Bilirubin Urine: NEGATIVE
Glucose, UA: NEGATIVE mg/dL
Hgb urine dipstick: NEGATIVE
Ketones, ur: NEGATIVE mg/dL
Leukocytes,Ua: NEGATIVE
Nitrite: NEGATIVE
Protein, ur: NEGATIVE mg/dL
Specific Gravity, Urine: 1.013 (ref 1.005–1.030)
pH: 6 (ref 5.0–8.0)

## 2019-12-28 LAB — TYPE AND SCREEN
ABO/RH(D): O POS
Antibody Screen: NEGATIVE

## 2019-12-28 MED ORDER — OXYTOCIN-SODIUM CHLORIDE 30-0.9 UT/500ML-% IV SOLN
2.5000 [IU]/h | INTRAVENOUS | Status: DC
  Filled 2019-12-28: qty 500

## 2019-12-28 MED ORDER — LACTATED RINGERS IV SOLN: INTRAVENOUS | Status: DC

## 2019-12-28 MED ORDER — LACTATED RINGERS IV SOLN: 500.0000 mL | INTRAVENOUS | Status: DC | PRN

## 2019-12-28 MED ORDER — LIDOCAINE HCL (PF) 1 % IJ SOLN: 30.0000 mL | INTRAMUSCULAR | Status: DC | PRN

## 2019-12-28 MED ORDER — ACETAMINOPHEN 325 MG PO TABS: 650.0000 mg | ORAL_TABLET | ORAL | Status: DC | PRN

## 2019-12-28 MED ORDER — BUTORPHANOL TARTRATE 1 MG/ML IJ SOLN: 1.0000 mg | INTRAMUSCULAR | Status: DC | PRN

## 2019-12-28 MED ORDER — ONDANSETRON HCL 4 MG/2ML IJ SOLN: 4.0000 mg | Freq: Four times a day (QID) | INTRAMUSCULAR | Status: DC | PRN

## 2019-12-28 MED ORDER — OXYTOCIN BOLUS FROM INFUSION
333.0000 mL | Freq: Once | INTRAVENOUS | Status: AC
  Administered 2019-12-29: 333 mL via INTRAVENOUS

## 2019-12-28 NOTE — OB Triage Note (Signed)
[facility] of contractions. Pt stated that contractions began this morning and have been around 5-10 minutes a part. Pt denies LOF, N/V and vaginal bleeding. Pt states that her mucous plug came out this morning. Pt states positive FM. Monitors applied and assessing. Initial FHT 145.

## 2019-12-28 NOTE — H&P (Signed)
Obstetrics Admission History & Physical   CC: Contraction pains in lower abdomen  HPI:  24 y.o. G2P0010 @ [redacted]w[redacted]d (01/01/2020, by Ultrasound). Admitted on 12/28/2019:   Patient Active Problem List   Diagnosis Date Noted  . Preterm contractions 12/28/2019  . Pregnancy related bilateral lower abdominal pain, antepartum 12/28/2019  . Normal labor and delivery 12/28/2019  . Hemorrhoids during pregnancy 12/03/2019  . Anxiety and depression 05/19/2019  . Vaginal bleeding 02/26/2019  . Supervision of low-risk first pregnancy, unspecified trimester 09/25/2018  . Marijuana use 09/25/2018     Presents for worsening lower abdominal pains c/w ctxs this aftermnoon, now every few minutes.  Denies VB or ROM.  Marland Kitchen   Prenatal care at: at [facility]. Pregnancy complicated by none.  Clinic [facility] Prenatal Labs  Dating LMP, [place] agrees Blood type: O/Positive/-- (12/17 1130)   Genetic Screen declines Antibody:Negative (12/17 1130)  Anatomic [place] [facility] nml Rubella: 3.89 (12/17 1130) Varicella: Imm  GTT       Third trimester: 105 RPR: Non Reactive (12/17 1130)   Rhogam n/a GBS negative  TDaP vaccine       10/1518           Flu Shot:   Baby Food   Breast                       Contraception   POP Pap:08/2018 nml, repeat PP    ROS: A review of systems was performed and negative, except as stated in the above HPI. PMHx:  Past Medical History:  Diagnosis Date  . Anemia   . Anxiety   . Eczema 2010  . Environmental allergies    PSHx:  Past Surgical History:  Procedure Laterality Date  . WISDOM TOOTH EXTRACTION     Medications:  Medications Prior to Admission  Medication Sig Dispense Refill Last Dose  . Prenat-Fe Carbonyl-FA-Omega 3 (ONE-A-DAY WOMENS PRENATAL 1) 28-0.8-235 MG CAPS Take by mouth.   12/27/2019 at Unknown time  . Hydrocortisone, Perianal, (PROCTOCORT) 1 % CREA Apply 1 applicator topically in the morning and at bedtime. 28 g 0 Unknown at Unknown time   Allergies: has No Known Allergies. OBHx:   OB History  Gravida Para Term Preterm AB Living  2 0 0 0 1    SAB TAB Ectopic Multiple Live Births  1 0 0        # Outcome Date GA Lbr Len/2nd Weight Sex Delivery Anes PTL Lv  2 Current           1 SAB      SAB      RJJ:OACZYSAY/TKZSWFUXNATF except as detailed in HPI.Marland Kitchen  No family history of birth defects. Soc Hx: Alcohol: none and Recreational drug use: none  Objective:   Vitals:   12/28/19 1914  BP: (!) 113/56  Pulse: 82  Resp: 16  Temp: 98.7 F (37.1 C)   Constitutional: Well nourished, well developed female in no acute distress.  HEENT: normal Skin: Warm and dry.  Cardiovascular:Regular rate and rhythm.   Extremity: trace to 1+ bilateral pedal edema Respiratory: Clear to auscultation bilateral. Normal respiratory effort Abdomen: gravid, ND, FHT present, mild tenderness on exam Back: no CVAT Neuro: DTRs 2+, Cranial nerves grossly intact Psych: Alert and Oriented x3. No memory deficits. Normal mood and affect.  MS: normal gait, normal bilateral lower extremity ROM/strength/stability.  Pelvic exam: is not limited by body habitus EGBUS: within normal limits Vagina: within normal limits and with normal mucosa Cervix: EXTERNAL  GENITALIA: normal appearing vulva with no masses, tenderness or lesions CERVIX: 3 cm dilated, 80 effaced, -2 station, presenting part Vtx VAGINA: no abnormalities noted MEMBRANES: intact Uterus: Spontaneous uterine activity  Adnexa: not evaluated  EFM:FHR: 150 bpm, variability: moderate,  accelerations:  Present,  decelerations:  Absent Toco: Frequency: Every 5-10 minutes   Perinatal info:  Blood type: O positive Rubella- Immune Varicella -Immune TDaP Given during third trimester of this pregnancy RPR NR / HIV Neg/ HBsAg Neg   Assessment & Plan:   24 y.o. G2P0010 @ [redacted]w[redacted]d, Admitted on 12/28/2019:Early labor with progress of cervical dilation and reg ctxs    Admit for labor, Observe for cervical change, Fetal Wellbeing Reassuring, Epidural  when ready, AROM when Appropriate and GBS status NEG, treat as needed  Annamarie Major, MD, [facility], [facility] 12/28/2019  10:10 PM

## 2019-12-29 ENCOUNTER — Inpatient Hospital Stay: Payer: 59 | Admitting: Anesthesiology

## 2019-12-29 ENCOUNTER — Encounter: Payer: Self-pay | Admitting: Obstetrics & Gynecology

## 2019-12-29 DIAGNOSIS — Z3A39 39 weeks gestation of pregnancy: Secondary | ICD-10-CM

## 2019-12-29 DIAGNOSIS — Z3403 Encounter for supervision of normal first pregnancy, third trimester: Secondary | ICD-10-CM

## 2019-12-29 LAB — RESPIRATORY PANEL BY RT PCR (FLU A&B, COVID)
Influenza A by PCR: NEGATIVE
Influenza B by PCR: NEGATIVE
SARS Coronavirus 2 by RT PCR: NEGATIVE

## 2019-12-29 LAB — RPR: RPR Ser Ql: NONREACTIVE

## 2019-12-29 LAB — ABO/RH: ABO/RH(D): O POS

## 2019-12-29 LAB — SARS CORONAVIRUS 2 BY RT PCR (HOSPITAL ORDER, PERFORMED IN ~~LOC~~ HOSPITAL LAB): SARS Coronavirus 2: NEGATIVE

## 2019-12-29 MED ORDER — LIDOCAINE HCL (PF) 1 % IJ SOLN
INTRAMUSCULAR | Status: DC | PRN
  Administered 2019-12-29: 3 mL via SUBCUTANEOUS

## 2019-12-29 MED ORDER — OXYTOCIN 10 UNIT/ML IJ SOLN
INTRAMUSCULAR | Status: AC
  Filled 2019-12-29: qty 2

## 2019-12-29 MED ORDER — ONDANSETRON HCL 4 MG/2ML IJ SOLN: 4.0000 mg | INTRAMUSCULAR | Status: DC | PRN

## 2019-12-29 MED ORDER — WITCH HAZEL-GLYCERIN EX PADS
1.0000 "application " | MEDICATED_PAD | CUTANEOUS | Status: DC | PRN
  Administered 2019-12-29: 1 via TOPICAL
  Filled 2019-12-29: qty 100

## 2019-12-29 MED ORDER — EPHEDRINE 5 MG/ML INJ: 10.0000 mg | INTRAVENOUS | Status: DC | PRN

## 2019-12-29 MED ORDER — SIMETHICONE 80 MG PO CHEW: 80.0000 mg | CHEWABLE_TABLET | ORAL | Status: DC | PRN

## 2019-12-29 MED ORDER — COCONUT OIL OIL
1.0000 "application " | TOPICAL_OIL | Status: DC | PRN
  Administered 2019-12-29: 1 via TOPICAL
  Filled 2019-12-29 (×2): qty 120

## 2019-12-29 MED ORDER — LIDOCAINE HCL (PF) 1 % IJ SOLN
INTRAMUSCULAR | Status: AC
  Filled 2019-12-29: qty 30

## 2019-12-29 MED ORDER — PRENATAL MULTIVITAMIN CH
1.0000 | ORAL_TABLET | Freq: Every day | ORAL | Status: DC
  Administered 2019-12-29 – 2019-12-30 (×2): 1 via ORAL
  Filled 2019-12-29 (×2): qty 1

## 2019-12-29 MED ORDER — SODIUM CHLORIDE 0.9 % IV SOLN
INTRAVENOUS | Status: DC | PRN
  Administered 2019-12-29 (×2): 5 mL via EPIDURAL

## 2019-12-29 MED ORDER — MISOPROSTOL 200 MCG PO TABS
ORAL_TABLET | ORAL | Status: AC
  Filled 2019-12-29: qty 4

## 2019-12-29 MED ORDER — IBUPROFEN 600 MG PO TABS
600.0000 mg | ORAL_TABLET | Freq: Four times a day (QID) | ORAL | Status: DC
  Administered 2019-12-29 – 2019-12-30 (×3): 600 mg via ORAL
  Filled 2019-12-29 (×4): qty 1

## 2019-12-29 MED ORDER — FENTANYL 2.5 MCG/ML W/ROPIVACAINE 0.15% IN NS 100 ML EPIDURAL (ARMC)
EPIDURAL | Status: DC | PRN
  Administered 2019-12-29: 12 mL/h via EPIDURAL

## 2019-12-29 MED ORDER — AMMONIA AROMATIC IN INHA
RESPIRATORY_TRACT | Status: AC
  Filled 2019-12-29: qty 10

## 2019-12-29 MED ORDER — OXYTOCIN-SODIUM CHLORIDE 30-0.9 UT/500ML-% IV SOLN
1.0000 m[IU]/min | INTRAVENOUS | Status: DC
  Administered 2019-12-29: 2 m[IU]/min via INTRAVENOUS

## 2019-12-29 MED ORDER — ACETAMINOPHEN 325 MG PO TABS
650.0000 mg | ORAL_TABLET | ORAL | Status: DC | PRN
  Administered 2019-12-30: 650 mg via ORAL
  Filled 2019-12-29 (×3): qty 2

## 2019-12-29 MED ORDER — LIDOCAINE-EPINEPHRINE (PF) 1.5 %-1:200000 IJ SOLN
INTRAMUSCULAR | Status: DC | PRN
  Administered 2019-12-29: 3 mL via EPIDURAL

## 2019-12-29 MED ORDER — SENNOSIDES-DOCUSATE SODIUM 8.6-50 MG PO TABS
2.0000 | ORAL_TABLET | ORAL | Status: DC
  Administered 2019-12-30: 2 via ORAL
  Filled 2019-12-29: qty 2

## 2019-12-29 MED ORDER — PHENYLEPHRINE 40 MCG/ML (10ML) SYRINGE FOR IV PUSH (FOR BLOOD PRESSURE SUPPORT): 80.0000 ug | PREFILLED_SYRINGE | INTRAVENOUS | Status: DC | PRN

## 2019-12-29 MED ORDER — FENTANYL 2.5 MCG/ML W/ROPIVACAINE 0.15% IN NS 100 ML EPIDURAL (ARMC)
EPIDURAL | Status: AC
  Filled 2019-12-29: qty 100

## 2019-12-29 MED ORDER — OXYTOCIN-SODIUM CHLORIDE 30-0.9 UT/500ML-% IV SOLN
INTRAVENOUS | Status: AC
  Filled 2019-12-29: qty 500

## 2019-12-29 MED ORDER — FENTANYL 2.5 MCG/ML W/ROPIVACAINE 0.15% IN NS 100 ML EPIDURAL (ARMC): 12.0000 mL/h | EPIDURAL | Status: DC

## 2019-12-29 MED ORDER — ONDANSETRON HCL 4 MG PO TABS: 4.0000 mg | ORAL_TABLET | ORAL | Status: DC | PRN

## 2019-12-29 MED ORDER — TERBUTALINE SULFATE 1 MG/ML IJ SOLN: 0.2500 mg | Freq: Once | INTRAMUSCULAR | Status: DC | PRN

## 2019-12-29 MED ORDER — LACTATED RINGERS IV SOLN
500.0000 mL | Freq: Once | INTRAVENOUS | Status: AC
  Administered 2019-12-29: 500 mL via INTRAVENOUS

## 2019-12-29 MED ORDER — BENZOCAINE-MENTHOL 20-0.5 % EX AERO
1.0000 "application " | INHALATION_SPRAY | CUTANEOUS | Status: DC | PRN
  Administered 2019-12-29: 1 via TOPICAL
  Filled 2019-12-29: qty 56

## 2019-12-29 MED ORDER — DIPHENHYDRAMINE HCL 50 MG/ML IJ SOLN: 12.5000 mg | INTRAMUSCULAR | Status: DC | PRN

## 2019-12-29 MED ORDER — DIBUCAINE (PERIANAL) 1 % EX OINT: 1.0000 "application " | TOPICAL_OINTMENT | CUTANEOUS | Status: DC | PRN

## 2019-12-29 MED ORDER — DIPHENHYDRAMINE HCL 25 MG PO CAPS: 25.0000 mg | ORAL_CAPSULE | Freq: Four times a day (QID) | ORAL | Status: DC | PRN

## 2019-12-29 NOTE — Progress Notes (Signed)
°  Labor Progress Note   24 y.o. G2P0010 @ [redacted]w[redacted]d , admitted for  Pregnancy, Labor Management.   Subjective:  Feels better w epidural  Objective:  BP 115/65    Pulse 74    Temp 98 F (36.7 C) (Oral)    Resp 18    Ht 5\' 6"  (1.676 m)    Wt 83 kg    LMP 03/22/2019 (Exact Date)    SpO2 98%    BMI 29.54 kg/m  Abd: gravid, ND, FHT present, mild tenderness on exam Extr: trace to 1+ bilateral pedal edema SVE: CERVIX: 5 cm dilated, 90 effaced, -1 station AROM CLEAR EFM: FHR: 150 bpm, variability: moderate,  accelerations:  Present,  decelerations:  Absent Toco: Frequency: Every 5-7 minutes Labs: I have reviewed the patient's lab results.   Assessment & Plan:  G2P0010 @ [redacted]w[redacted]d, admitted for  Pregnancy and Labor/Delivery Management  1. Pain management: epidural. 2. FWB: FHT category 1.  3. ID: GBS negative 4. Labor management: AROM clear Consider Pitocin to maintain active labor  All discussed with patient, see orders  [redacted]w[redacted]d, MD, Annamarie Major Ob/Gyn, [facility] 12/29/2019  4:29 AM

## 2019-12-29 NOTE — Discharge Summary (Addendum)
OB Discharge Summary     Patient Name: Karen May DOB: 1996-05-25 MRN: [id number]  Date of admission: 12/28/2019 Delivering provider: Tresea Mall, CNM  Date of Delivery: 12/29/2019  Date of discharge: 12/31/2019  Admitting diagnosis: Onset of labor  Intrauterine pregnancy: [redacted]w[redacted]d     Secondary diagnosis: None     Discharge diagnosis: Term Pregnancy Delivered                                                                                                Post partum procedures:none  Augmentation: AROM and Pitocin  Complications: None  Hospital course:  Onset of Labor With Vaginal Delivery      24 y.o. yo G2P1011 at [redacted]w[redacted]d was admitted in Latent Labor on 12/28/2019.  Patient had an uncomplicated labor course as follows:  Membrane Rupture Time/Date: 4:27 AM ,12/29/2019   Delivery Method:Vaginal, Spontaneous  Episiotomy: None  Lacerations:  1st degree;Perineal  Patient had an uncomplicated postpartum course. She is  voiding without difficulty, ambulating, tolerating regular diet and caring for her infant. .  Patient is discharged home in stable condition on 12/31/19.  Newborn Data: Birth date: 12/29/2019  Birth time: 9:48 AM  Gender: Female  [name] Jeri Modena Living status: Living  Apgars: 8 ,9  Weight: 2950 g, 6 pounds 8 ounces  Physical exam  Vitals:   12/30/19 0908 12/30/19 1550 12/31/19 0004 12/31/19 0814  BP: 110/65 123/87 111/61 110/67  Pulse: 80 77 74 67  Resp: 20 18 18 18   Temp: 98 F (36.7 C) 98.3 F (36.8 C) 98.2 F (36.8 C) 97.8 F (36.6 C)  TempSrc:   Oral Oral  SpO2: 99% 98% 99% 100%  Weight:      Height:       General: alert, cooperative and no distress Lochia: appropriate Uterine Fundus: firm/ at U/just right of midline (needs to void)/ NT Incision: N/A DVT Evaluation: No evidence of DVT seen on physical exam. No edema of lower extremities Labs: Lab Results  Component Value Date   WBC 17.1 (H) 12/30/2019   HGB 13.5 12/30/2019   HCT 39.0 12/30/2019    MCV 88.8 12/30/2019   PLT 141 (L) 12/30/2019    Discharge instruction: per After Visit Summary.  Medications:  Allergies as of 12/31/2019   No Known Allergies     Medication List    TAKE these medications   acetaminophen 325 MG tablet Commonly known as: Tylenol Take 2 tablets (650 mg total) by mouth every 4 (four) hours as needed for mild pain or headache (for pain scale < 4).   Hydrocortisone (Perianal) 1 % Crea Commonly known as: Proctocort Apply 1 applicator topically in the morning and at bedtime.   ibuprofen 600 MG tablet Commonly known as: ADVIL Take 1 tablet (600 mg total) by mouth every 6 (six) hours as needed for moderate pain or cramping.   norethindrone 0.35 MG tablet Commonly known as: Errin Take 1 tablet (0.35 mg total) by mouth daily. Start taking on: February 01, 2020   One-A-Day Womens Prenatal 1 28-0.8-235 MG Caps Take by mouth.       Diet:  routine diet  Activity: Advance as tolerated. Pelvic rest for 6 weeks.   Outpatient follow up:  Follow-up Information    Tresea Mall, CNM. Go on 02/12/2020.   Specialty: Obstetrics Why: Postpartum follow up: Thursday, September 16th at 1:30pm Contact information: [address] [place] [address] [phone]                 Postpartum contraception: Progesterone only pills Rhogam Given postpartum: NA Rubella vaccine given postpartum: no Varicella vaccine given postpartum: no TDaP given antepartum or postpartum: given antepartum   Newborn Delivery   Birth date/time: 12/29/2019 09:48:00 Delivery type: Vaginal, Spontaneous       Baby Feeding: Breast  Disposition:home with mother  SIGNED:  Farrel Conners, CNM 12/31/2019 9:47 AM

## 2019-12-29 NOTE — Lactation Note (Signed)
This note was copied from a baby's chart. Lactation Consultation Note  Patient Name: Karen May JOINO'M Date: 12/29/2019 Reason for consult: Follow-up assessment;Mother's request;Primapara;Term;Other (Comment) (Firm but compressible areola/nipple)  Assisted mom with first breast feed in [facility] right after delivery and he fed well at both breasts.  Now mom is requesting assistance again.  Feeding cues have been shown to mom and she is committed to put [name] Jeri Modena to the breast whenever he demonstrates hunger cues.  He is putting his hands to his mouth and mom needs assistance with positioning and keeping hands down and sandwiching the breast to get a good deep latch.  Demonstrated hand expression of a few drops of colostrum to entice him to latch.  Mom is tender when trying to hand express, but denies much discomfort when [name] is sucking.  Mom has hard areola/nipple that is semi flat, but everts enough for [name] to latch when compressed.  Mom can feel strong rhythmic tugs at the breast with an occasional swallow.  [name] breast fed on right breast for 23 minutes in cradle hold. He came off the breast and was a little fussy possibly because he was having a meconium bowel movement but no void was noted. He seems to prefer the right breast and has an easier time sustaining the latch on that side longer.  He breast fed for 8 minutes on the left breast, but had to support the breast more and gently put pressure on his neck and shoulders to keep him from coming on and off the breast.  Discussed other breast feeding positions that she might want to try for next breast feedings.  Reviewed normal newborn stomach size, supply and demand, adequate intake and output, normal course of lactation and routine newborn feeding patterns.  Mom has already received her Mot[employer] DEBP through her [employer] and discussed when and how to begin pumping when needed.  Handout given on Lactation community resources and  reviewed.  Lactation name and number written on white board and encouraged to call with any questions, concerns or assistance. Maternal Data Formula Feeding for Exclusion: No Has patient been taught Hand Expression?: Yes Does the patient have breastfeeding experience prior to this delivery?: No (Gr2, but P1)  Feeding Feeding Type: Breast Fed  LATCH Score Latch: Repeated attempts needed to sustain latch, nipple held in mouth throughout feeding, stimulation needed to elicit sucking reflex.  Audible Swallowing: A few with stimulation  Type of Nipple: Flat (Semi-flat - everts with compression)  Comfort (Breast/Nipple): Soft / non-tender  Hold (Positioning): Assistance needed to correctly position infant at breast and maintain latch.  LATCH Score: 6  Interventions Interventions: Breast feeding basics reviewed;Assisted with latch;Skin to skin;Breast massage;Hand express;Reverse pressure;Breast compression;Adjust position;Support pillows;Position options;Coconut oil  Lactation Tools Discussed/Used WIC Program: No [employer])   Consult Status Consult Status: Follow-up Date: 12/29/19 Follow-up type: Call as needed    Louis Meckel 12/29/2019, 3:11 PM

## 2019-12-29 NOTE — Anesthesia Preprocedure Evaluation (Signed)
Anesthesia Evaluation  Patient identified by MRN, date of birth, ID band Patient awake    Reviewed: Allergy & Precautions, H&P , NPO status , Patient's Chart, lab work & pertinent test results, reviewed documented beta blocker date and time   History of Anesthesia Complications Negative for: history of anesthetic complications  Airway Mallampati: I  TM Distance: >3 FB Neck ROM: full    Dental no notable dental hx.    Pulmonary neg pulmonary ROS,    Pulmonary exam normal breath sounds clear to auscultation       Cardiovascular Exercise Tolerance: Good negative cardio ROS Normal cardiovascular exam Rhythm:regular Rate:Normal     Neuro/Psych negative neurological ROS  negative psych ROS   GI/Hepatic Neg liver ROS, GERD  ,  Endo/Other  negative endocrine ROS  Renal/GU negative Renal ROS  negative genitourinary   Musculoskeletal   Abdominal   Peds  Hematology  (+) Blood dyscrasia, anemia ,   Anesthesia Other Findings Past Medical History: No date: Anemia No date: Anxiety 2010: Eczema No date: Environmental allergies   Reproductive/Obstetrics (+) Pregnancy                             Anesthesia Physical Anesthesia Plan  ASA: II  Anesthesia Plan: Epidural   Post-op Pain Management:    Induction:   PONV Risk Score and Plan:   Airway Management Planned:   Additional Equipment:   Intra-op Plan:   Post-operative Plan:   Informed Consent: I have reviewed the patients History and Physical, chart, labs and discussed the procedure including the risks, benefits and alternatives for the proposed anesthesia with the patient or authorized representative who has indicated his/her understanding and acceptance.     Dental Advisory Given  Plan Discussed with: Anesthesiologist, CRNA and Surgeon  Anesthesia Plan Comments:         Anesthesia Quick Evaluation

## 2019-12-29 NOTE — Anesthesia Procedure Notes (Signed)
Epidural Patient location during procedure: OB Start time: 12/29/2019 2:22 AM End time: 12/29/2019 2:27 AM  Staffing Anesthesiologist: Lenard Simmer, MD Performed: anesthesiologist   Preanesthetic Checklist Completed: patient identified, IV checked, site marked, risks and benefits discussed, surgical consent, monitors and equipment checked, pre-op evaluation and timeout performed  Epidural Patient position: sitting Prep: ChloraPrep Patient monitoring: heart rate, continuous pulse ox and blood pressure Approach: midline Location: L3-L4 Injection technique: LOR saline  Needle:  Needle type: Tuohy  Needle gauge: 17 G Needle length: 9 cm and 9 Needle insertion depth: 5 cm Catheter type: closed end flexible Catheter size: 19 Gauge Catheter at skin depth: 10 cm Test dose: negative and 1.5% lidocaine with Epi 1:200 K  Assessment Sensory level: T10 Events: blood not aspirated, injection not painful, no injection resistance, no paresthesia and negative IV test  Additional Notes 1st attempt Pt. Evaluated and documentation done after procedure finished. Patient identified. Risks/Benefits/Options discussed with patient including but not limited to bleeding, infection, nerve damage, paralysis, failed block, incomplete pain control, headache, blood pressure changes, nausea, vomiting, reactions to medication both or allergic, itching and postpartum back pain. Confirmed with bedside nurse the patient's most recent platelet count. Confirmed with patient that they are not currently taking any anticoagulation, have any bleeding history or any family history of bleeding disorders. Patient expressed understanding and wished to proceed. All questions were answered. Sterile technique was used throughout the entire procedure. Please see nursing notes for vital signs. Test dose was given through epidural catheter and negative prior to continuing to dose epidural or start infusion. Warning signs of high  block given to the patient including shortness of breath, tingling/numbness in hands, complete motor block, or any concerning symptoms with instructions to call for help. Patient was given instructions on fall risk and not to get out of bed. All questions and concerns addressed with instructions to call with any issues or inadequate analgesia.   Patient tolerated the insertion well without immediate complications.Reason for block:procedure for pain

## 2019-12-30 LAB — CBC
HCT: 39 % (ref 36.0–46.0)
Hemoglobin: 13.5 g/dL (ref 12.0–15.0)
MCH: 30.8 pg (ref 26.0–34.0)
MCHC: 34.6 g/dL (ref 30.0–36.0)
MCV: 88.8 fL (ref 80.0–100.0)
Platelets: 141 10*3/uL — ABNORMAL LOW (ref 150–400)
RBC: 4.39 MIL/uL (ref 3.87–5.11)
RDW: 15.8 % — ABNORMAL HIGH (ref 11.5–15.5)
WBC: 17.1 10*3/uL — ABNORMAL HIGH (ref 4.0–10.5)
nRBC: 0 % (ref 0.0–0.2)

## 2019-12-30 MED ORDER — IBUPROFEN 600 MG PO TABS: 600.0000 mg | ORAL_TABLET | Freq: Four times a day (QID) | ORAL | 1 refills | Status: DC | PRN

## 2019-12-30 MED ORDER — ACETAMINOPHEN 325 MG PO TABS: 650.0000 mg | ORAL_TABLET | ORAL | Status: DC | PRN

## 2019-12-30 MED ORDER — IBUPROFEN 600 MG PO TABS
600.0000 mg | ORAL_TABLET | Freq: Four times a day (QID) | ORAL | Status: DC
  Administered 2019-12-30 – 2019-12-31 (×4): 600 mg via ORAL
  Filled 2019-12-30 (×4): qty 1

## 2019-12-30 MED ORDER — NORETHINDRONE 0.35 MG PO TABS: 1.0000 | ORAL_TABLET | Freq: Every day | ORAL | 11 refills | Status: DC

## 2019-12-30 NOTE — Progress Notes (Addendum)
Post Partum Day 1 Subjective: up ad lib, voiding and tolerating PO. Baby breast fed well yesterday, but not wanting to fed since his circumcision.   Objective: Blood pressure 110/65, pulse 80, temperature 98 F (36.7 C), resp. rate 20, height 5\' 6"  (1.676 m), weight 83 kg, last menstrual period 03/22/2019, SpO2 99 %, unknown if currently breastfeeding.  Physical Exam:  General: alert, cooperative and no distress Lochia: appropriate Uterine Fundus: firm/ at U/ML/NT DVT Evaluation: No evidence of DVT seen on physical exam. No significant calf/ankle edema.  Recent Labs    12/28/19 2224 12/30/19 0517  HGB 14.6 13.5  HCT 43.3 39.0  WBC 11.3* 17.1*  PLT 144* 141*    Assessment/Plan: PPD #1-stable Desires discharge later today Breast feeding-Lactation consult O pos/ RI/VI TDAP-given AP Contraception: POP  02/29/20, CNM     LOS: 2 days   Farrel Conners 12/30/2019, 10:07 AM   Addendum: Discharge was cancelled due to baby's elevated bilirubin.  02/29/2020, CNM

## 2019-12-30 NOTE — Discharge Instructions (Signed)
Discharge Instructions:   Follow-up Appointment: Thursday, September 16th at 1:30pm with Tresea Mall, CNM at [facility]!   If there are any new medications, they have been ordered and will be available for pickup at the listed pharmacy on your way home from the hospital.   Call office if you have any of the following: headache, visual changes, fever >101.0 F, chills, shortness of breath, breast concerns, excessive vaginal bleeding, incision drainage or problems, leg pain or redness, depression or any other concerns. If you have vaginal discharge with an odor, let your doctor know.   It is normal to bleed for up to 6 weeks. You should not soak through more than 1 pad in 1 hour. If you have a blood clot larger than your fist with continued bleeding, call your doctor.   Activity: Do not lift > 10 lbs for 6 weeks (do not lift anything heavier than your baby). No intercourse, tampons, swimming pools, hot tubs, baths (only showers) for 6 weeks.  No driving for 1-2 weeks. Continue prenatal vitamin, especially if breastfeeding. Increase calories and fluids (water) while breastfeeding.   Your milk will come in, in the next couple of days (right now it is colostrum). You may have a slight fever when your milk comes in, but it should go away on its own.  If it does not, and rises above 101 F please call the doctor. You will also feel achy and your breasts will be firm. They will also start to leak. If you are breastfeeding, continue as you have been and you can pump/express milk for comfort.   If you have too much milk, your breasts can become engorged, which could lead to mastitis. This is an infection of the milk ducts. It can be very painful and you will need to notify your doctor to obtain a prescription for antibiotics. You can also treat it with a shower or hot/cold compress.   For concerns about your baby, please call your pediatrician.  For breastfeeding concerns, the lactation consultant can  be reached at [phone].   Postpartum blues (feelings of happy one minute and sad another minute) are normal for the first few weeks but if it gets worse let your doctor know.   Congratulations! We enjoyed caring for you and your new bundle of joy!

## 2019-12-30 NOTE — Anesthesia Postprocedure Evaluation (Signed)
Anesthesia Post Note  Patient: Karen May  Procedure(s) Performed: AN AD HOC LABOR EPIDURAL  Patient location during evaluation: Mother Baby Anesthesia Type: Epidural Level of consciousness: awake, awake and alert and oriented Pain management: pain level controlled Vital Signs Assessment: post-procedure vital signs reviewed and stable Respiratory status: nonlabored ventilation, respiratory function stable and spontaneous breathing Cardiovascular status: blood pressure returned to baseline and stable Postop Assessment: no headache and no backache Anesthetic complications: no   No complications documented.   Last Vitals:  Vitals:   12/29/19 2344 12/30/19 0403  BP: (!) 107/54 109/61  Pulse: 85 78  Resp: 18 18  Temp: 36.8 C 36.7 C  SpO2: 99% 98%    Last Pain:  Vitals:   12/30/19 0403  TempSrc: Oral  PainSc:                  [name]

## 2019-12-30 NOTE — Clinical Social Work Maternal (Signed)
  CLINICAL SOCIAL WORK MATERNAL/CHILD NOTE  Patient Details  Name: Karen May MRN: [id number] Date of Birth: 1995-08-16  Date:  12/30/2019  Clinical Social Worker Initiating Note:  Jhonnie Garner, RNCM Date/Time: Initiated:  12/30/19/1130     Child's Name:  Karen May   Biological Parents:  Mother, Father   Need for Interpreter:  None   Reason for Referral:  Current Substance Use/Substance Use During Pregnancy    Address:  [address] [place] [address]    Phone number:  [phone] (home)     Additional phone number:   Household Members/Support Persons (HM/SP):   Household Member/Support Person 1   HM/SP Name Relationship DOB or Age  HM/SP -1 Karen May FOB    HM/SP -2        HM/SP -3        HM/SP -4        HM/SP -5        HM/SP -6        HM/SP -7        HM/SP -8          Natural Supports (not living in the home):  Immediate Family, Friends   Chiropodist:     Employment: Animator   Type of Work: Community education officer   Education:  [employer] school graduate   Homebound arranged:    Museum/gallery curator Resources:  Multimedia programmer   Other Resources:      Cultural/Religious Considerations Which May Impact Care:    Strengths:  Pediatrician chosen   Psychotropic Medications:         Pediatrician:    Ambulance person List:   [place]      Pediatrician Fax Number:    Risk Factors/Current Problems:  Substance Use    Cognitive State:  Goal Oriented , Alert    Mood/Affect:  Calm    CSW Assessment: RNCM met with MOB at bedside, FOB and grandmother also present. RNCM explained role and discussed speaking alone with MOB however she wanted visitors to remain in the room. MOB was alert and pleasant and reported that she had all necessary items for baby at home where she lives with  the FOB. She reports that she does not have a PCP and does not take any medications. She denies any substance abuse history. Did notify mother that due to positive drug screen for Marijuana baby's cord blood will be monitored and if positive a CPS report will be made. MOB verbalizes understanding to this plan. No other needs identified at this time and no concerns voiced by mother baby staff.   CSW Plan/Description:  No Further Intervention Required/No Barriers to Discharge    Flavia, Bruss, RN 12/30/2019, 12:29 PM

## 2019-12-31 ENCOUNTER — Encounter: Payer: 59 | Admitting: Obstetrics and Gynecology

## 2019-12-31 NOTE — Progress Notes (Signed)
Pt discharged with infant. Discharge instructions, prescriptions, and follow up appointments given to and reviewed with patient. Pt verbalized understanding. Escorted out by auxillary.  

## 2020-02-12 ENCOUNTER — Ambulatory Visit (INDEPENDENT_AMBULATORY_CARE_PROVIDER_SITE_OTHER): Payer: 59 | Admitting: Advanced Practice Midwife

## 2020-02-12 ENCOUNTER — Encounter: Payer: Self-pay | Admitting: Advanced Practice Midwife

## 2020-02-12 ENCOUNTER — Other Ambulatory Visit: Payer: Self-pay

## 2020-02-12 NOTE — Progress Notes (Signed)
Postpartum Visit  Chief Complaint:  Chief Complaint  Patient presents with   Postpartum Care    History of Present Illness: Patient is a 24 y.o. G2P1011 presents for postpartum visit.  Review the Delivery Report for details.  Date of delivery: 12/29/2019 Type of delivery: Vaginal delivery - Vacuum or forceps assisted  no Episiotomy No.  Laceration: 1st degree not repaired  Pregnancy or labor problems:  no Any problems since the delivery:  No  She is planning to get the Covid vaccine next week. Advised her to take extra vitamin C and vitamin D3 prior to vaccine.  Newborn Details:  SINGLETON :  1. BabyGender female. Birth weight: 6 pounds 8 ounces Maternal Details:  Breast or formula feeding: breastfeeding- plans 6 months to 1 year Intercourse: No  Contraception after delivery: POPs Any bowel or bladder issues: No hemorrhoidal tissue has shrunk Post partum depression/anxiety noted:  no [place] Post-Partum Depression Score: 1 Date of last PAP: 1 year ago  no abnormalities   Review of Systems: Review of Systems  Constitutional: Negative for chills and fever.  HENT: Negative for congestion, ear discharge, ear pain, hearing loss, sinus pain and sore throat.   Eyes: Negative for blurred vision and double vision.  Respiratory: Negative for cough, shortness of breath and wheezing.   Cardiovascular: Negative for chest pain, palpitations and leg swelling.  Gastrointestinal: Negative for abdominal pain, blood in stool, constipation, diarrhea, heartburn, melena, nausea and vomiting.  Genitourinary: Negative for dysuria, flank pain, frequency, hematuria and urgency.  Musculoskeletal: Negative for back pain, joint pain and myalgias.  Skin: Negative for itching and rash.  Neurological: Negative for dizziness, tingling, tremors, sensory change, speech change, focal weakness, seizures, loss of consciousness, weakness and headaches.  Endo/Heme/Allergies: Negative for environmental  allergies. Does not bruise/bleed easily.  Psychiatric/Behavioral: Negative for depression, hallucinations, memory loss, substance abuse and suicidal ideas. The patient is not nervous/anxious and does not have insomnia.      Past Medical History:  Past Medical History:  Diagnosis Date   Anemia    Anxiety    Eczema 2010   Environmental allergies     Past Surgical History:  Past Surgical History:  Procedure Laterality Date   WISDOM TOOTH EXTRACTION      Family History:  Family History  Problem Relation Age of Onset   Lung cancer Maternal Grandmother    Diabetes Paternal Grandmother    Breast cancer Maternal Aunt 6       maternal great aunt   Cancer Cousin    Breast cancer Cousin 27   Schizophrenia Maternal Aunt    Diabetes Cousin     Social History:  Social History   Socioeconomic History   Marital status: Significant Other    Spouse name: Devon   Number of children: 0   Years of education: 16   Highest education level: Bachelor's degree (e.g., BA, AB, BS)  Occupational History   Occupation: Rehab specialist  Tobacco Use   Smoking status: Never Smoker   Smokeless tobacco: Never Used  Vaping Use   Vaping Use: Never used  Substance and Sexual Activity   Alcohol use: Not Currently    Comment: occ   Drug use: Not Currently   Sexual activity: Not Currently    Partners: Male    Birth control/protection: None  Other Topics Concern   Not on file  Social History Narrative   ** Merged History Encounter **       Social Determinants of Health  Financial Resource Strain:    Difficulty of Paying Living Expenses: Not on file  Food Insecurity:    Worried About Programme researcher, broadcasting/film/video in the Last Year: Not on file   [employer] of Food in the Last Year: Not on file  Transportation Needs:    Lack of Transportation (Medical): Not on file   Lack of Transportation (Non-Medical): Not on file  Physical Activity:    Days of Exercise per Week: Not  on file   Minutes of Exercise per Session: Not on file  Stress:    Feeling of Stress : Not on file  Social Connections:    Frequency of Communication with Friends and Family: Not on file   Frequency of Social Gatherings with Friends and Family: Not on file   Attends Religious Services: Not on file   Active Member of Clubs or Organizations: Not on file   Attends Banker Meetings: Not on file   Marital Status: Not on file  Intimate Partner Violence:    Fear of Current or Ex-Partner: Not on file   Emotionally Abused: Not on file   Physically Abused: Not on file   Sexually Abused: Not on file    Allergies:  No Known Allergies  Medications: Prior to Admission medications   Medication Sig Start Date End Date Taking? Authorizing Provider  acetaminophen (TYLENOL) 325 MG tablet Take 2 tablets (650 mg total) by mouth every 4 (four) hours as needed for mild pain or headache (for pain scale < 4). 12/30/19  Yes Farrel Conners, CNM  Prenat-Fe Carbonyl-FA-Omega 3 (ONE-A-DAY WOMENS PRENATAL 1) 28-0.8-235 MG CAPS Take by mouth.   Yes [provider]  Hydrocortisone, Perianal, (PROCTOCORT) 1 % CREA Apply 1 applicator topically in the morning and at bedtime. Patient not taking: Reported on 02/12/2020 12/03/19   Mirna Mires, CNM  ibuprofen (ADVIL) 600 MG tablet Take 1 tablet (600 mg total) by mouth every 6 (six) hours as needed for moderate pain or cramping. Patient not taking: Reported on 02/12/2020 12/30/19   Farrel Conners, CNM  norethindrone (ERRIN) 0.35 MG tablet Take 1 tablet (0.35 mg total) by mouth daily. 02/01/20   Farrel Conners, CNM    Physical Exam Blood pressure 120/70, height 5\' 6"  (1.676 m), weight 158 lb 12.8 oz (72 kg), currently breastfeeding.    General: NAD HEENT: normocephalic, anicteric Pulmonary: No increased work of breathing Abdomen: NABS, soft, non-tender, non-distended.  Umbilicus without lesions.  No hepatomegaly, splenomegaly  or masses palpable. No evidence of hernia. Genitourinary:  External: Normal external female genitalia.  Normal urethral meatus, normal Bartholin's and Skene's glands.    Vagina: Normal vaginal mucosa, no evidence of prolapse.    Cervix: Grossly normal in appearance, no bleeding, no CMT  Uterus: Non-enlarged, mobile, normal contour.    Adnexa: ovaries non-enlarged, no adnexal masses  Rectal: deferred Extremities: no edema, erythema, or tenderness Neurologic: Grossly intact Psychiatric: mood appropriate, affect full   Edinburgh Postnatal Depression Scale - 02/12/20 1339      Edinburgh Postnatal Depression Scale:  In the Past 7 Days   I have been able to laugh and see the funny side of things. 0    I have looked forward with enjoyment to things. 0    I have blamed myself unnecessarily when things went wrong. 0    I have been anxious or worried for no good reason. 0    I have felt scared or panicky for no good reason. 0    Things  have been getting on top of me. 0    I have been so unhappy that I have had difficulty sleeping. 0    I have felt sad or miserable. 1    I have been so unhappy that I have been crying. 0    The thought of harming myself has occurred to me. 0    Edinburgh Postnatal Depression Scale Total 1           Assessment: 24 y.o. [id number] presenting for 6 week postpartum visit  Plan: Problem List Items Addressed This Visit    None    Visit Diagnoses    6 weeks postpartum follow-up    -  Primary       1) Contraception - Education given regarding options for contraception, as well as compatibility with breast feeding if applicable.  Patient plans on oral progesterone-only contraceptive for contraception.  2)  Pap - ASCCP guidelines and rational discussed.  ASCCP guidelines and rationale discussed.  Patient opts for every 3 years screening interval  3) Patient underwent screening for postpartum depression with no signs of depression  4) Return in about 1 year  (around 02/11/2021) for annual established gyn.   Tresea Mall, CNM [facility] 02/12/2020, 1:43 PM

## 2020-06-07 ENCOUNTER — Other Ambulatory Visit: Payer: Self-pay

## 2020-06-07 DIAGNOSIS — Z20822 Contact with and (suspected) exposure to covid-19: Secondary | ICD-10-CM

## 2020-06-10 LAB — NOVEL CORONAVIRUS, NAA: SARS-CoV-2, NAA: NOT DETECTED

## 2021-03-07 ENCOUNTER — Ambulatory Visit (INDEPENDENT_AMBULATORY_CARE_PROVIDER_SITE_OTHER): Payer: Medicaid Other | Admitting: Obstetrics and Gynecology

## 2021-03-07 ENCOUNTER — Encounter: Payer: Self-pay | Admitting: Obstetrics and Gynecology

## 2021-03-07 ENCOUNTER — Other Ambulatory Visit: Payer: Self-pay

## 2021-03-07 ENCOUNTER — Other Ambulatory Visit (HOSPITAL_COMMUNITY)
Admission: RE | Admit: 2021-03-07 | Discharge: 2021-03-07 | Disposition: A | Discharge status: Home / Self Care | Payer: Medicaid Other | Source: Ambulatory Visit | Attending: Obstetrics and Gynecology | Admitting: Obstetrics and Gynecology

## 2021-03-07 VITALS — BP 122/74 | Ht 66.0 in | Wt 164.0 lb

## 2021-03-07 DIAGNOSIS — Z113 Encounter for screening for infections with a predominantly sexual mode of transmission: Secondary | ICD-10-CM | POA: Diagnosis not present

## 2021-03-07 DIAGNOSIS — Z1331 Encounter for screening for depression: Secondary | ICD-10-CM

## 2021-03-07 DIAGNOSIS — Z124 Encounter for screening for malignant neoplasm of cervix: Secondary | ICD-10-CM | POA: Insufficient documentation

## 2021-03-07 DIAGNOSIS — Z01419 Encounter for gynecological examination (general) (routine) without abnormal findings: Secondary | ICD-10-CM | POA: Diagnosis not present

## 2021-03-07 DIAGNOSIS — Z1339 Encounter for screening examination for other mental health and behavioral disorders: Secondary | ICD-10-CM | POA: Diagnosis not present

## 2021-03-07 DIAGNOSIS — Z0000 Encounter for general adult medical examination without abnormal findings: Secondary | ICD-10-CM

## 2021-03-07 NOTE — Progress Notes (Signed)
Gynecology Annual Exam  PCP: Patient, No Pcp Per (Inactive)  Chief Complaint  Patient presents with   Annual Exam   History of Present Illness:  [name] is a 25 y.o. G2P1011 who LMP was Patient's last menstrual period was 02/17/2021 (exact date)., presents today for her annual examination.  She had her first period about a month ago.    She is sexually active. She has no pain with intercourse.  Last Pap: 08/2018  Results were: no abnormalities /neg HPV DNA not done due to age Hx of STDs: none  There is no FH of breast cancer. There is no FH of ovarian cancer. The patient does not do self-breast exams.  Tobacco use: The patient denies current or previous tobacco use. Alcohol use: social drinker Exercise: none  She was given Keflex for mastitis about a month ago and her baby boy had a skin rash. She is still breast feeding.  She is using condoms for birth control.    The patient wears seatbelts: yes.   The patient reports that domestic violence in her life is absent.   Past Medical History:  Diagnosis Date   Anemia    Anxiety    Eczema 2010   Environmental allergies     Past Surgical History:  Procedure Laterality Date   WISDOM TOOTH EXTRACTION      Prior to Admission medications: Denies    Allergies: No Known Allergies  Obstetric History: G2P1011, s/p SVD x 1, 6.5 lbs  Social History   Socioeconomic History   Marital status: Significant Other    Spouse name: Devon   Number of children: 0   Years of education: 16   Highest education level: Bachelor's degree (e.g., BA, AB, BS)  Occupational History   Occupation: Rehab specialist  Tobacco Use   Smoking status: Never   Smokeless tobacco: Never  Vaping Use   Vaping Use: Never used  Substance and Sexual Activity   Alcohol use: Not Currently    Comment: occ   Drug use: Not Currently   Sexual activity: Not Currently    Partners: Male    Birth control/protection: None  Other Topics Concern   Not  on file  Social History Narrative   ** Merged History Encounter **       Social Determinants of Health   Financial Resource Strain: Not on file  Food Insecurity: Not on file  Transportation Needs: Not on file  Physical Activity: Not on file  Stress: Not on file  Social Connections: Not on file  Intimate Partner Violence: Not on file    Family History  Problem Relation Age of Onset   Lung cancer Maternal Grandmother    Diabetes Paternal Grandmother    Breast cancer Maternal Aunt 59       maternal great aunt   Cancer Cousin    Breast cancer Cousin 27   Schizophrenia Maternal Aunt    Diabetes Cousin     Review of Systems  Constitutional: Negative.   HENT: Negative.    Eyes: Negative.   Respiratory: Negative.    Cardiovascular: Negative.   Gastrointestinal: Negative.   Genitourinary: Negative.   Musculoskeletal: Negative.   Skin: Negative.   Neurological: Negative.   Psychiatric/Behavioral: Negative.      Physical Exam BP 122/74   Ht 5\' 6"  (1.676 m)   Wt 164 lb (74.4 kg)   LMP 02/17/2021 (Exact Date)   BMI 26.47 kg/m    Physical Exam Constitutional:  General: She is not in acute distress.    Appearance: Normal appearance. She is well-developed.  Genitourinary:     Vulva and bladder normal.     Right Labia: No rash, tenderness, lesions, skin changes or Bartholin's cyst.    Left Labia: No tenderness, lesions, skin changes, Bartholin's cyst or rash.    No inguinal adenopathy present in the right or left side.    Pelvic Tanner Score: 5/5.    No vaginal discharge, erythema, tenderness or bleeding.      Right Adnexa: not tender, not full and no mass present.    Left Adnexa: not tender, not full and no mass present.    No cervical motion tenderness, discharge, lesion or polyp.     Uterus is not enlarged or tender.     No uterine mass detected.    Pelvic exam was performed with patient in the lithotomy position.  HENT:     Head: Normocephalic and  atraumatic.  Eyes:     General: No scleral icterus.    Conjunctiva/sclera: Conjunctivae normal.  Neck:     Thyroid: No thyromegaly.  Cardiovascular:     Rate and Rhythm: Normal rate and regular rhythm.     Heart sounds: No murmur heard.   No friction rub. No gallop.  Pulmonary:     Effort: Pulmonary effort is normal. No respiratory distress.     Breath sounds: Normal breath sounds. No wheezing or rales.  Abdominal:     General: Bowel sounds are normal. There is no distension.     Palpations: Abdomen is soft. There is no mass.     Tenderness: There is no abdominal tenderness. There is no guarding or rebound.     Hernia: There is no hernia in the left inguinal area or right inguinal area.  Musculoskeletal:        General: No swelling or tenderness. Normal range of motion.     Cervical back: Normal range of motion and neck supple.  Lymphadenopathy:     Cervical: No cervical adenopathy.     Lower Body: No right inguinal adenopathy. No left inguinal adenopathy.  Neurological:     General: No focal deficit present.     Mental Status: She is alert and oriented to person, place, and time.     Cranial Nerves: No cranial nerve deficit.  Skin:    General: Skin is warm and dry.     Findings: No erythema or rash.  Psychiatric:        Mood and Affect: Mood normal.        Behavior: Behavior normal.        Judgment: Judgment normal.    Female chaperone present for pelvic and breast  portions of the physical exam  Results: AUDIT Questionnaire (screen for alcoholism): 1 PHQ-9: 0   Assessment: 25 y.o. G58P1011 female here for routine annual gynecologic examination  Plan: Problem List Items Addressed This Visit   None Visit Diagnoses     Women's annual routine gynecological examination    -  Primary   Relevant Orders   Cytology - PAP   Screening for depression       Screening for alcoholism       Screen for STD (sexually transmitted disease)       Relevant Orders   Cytology - PAP    Pap smear for cervical cancer screening       Relevant Orders   Cytology - PAP       Screening: --  Blood pressure screen normal -- Weight screening: normal -- Depression screening negative (PHQ-9) -- Nutrition: normal -- cholesterol screening: not due for screening -- osteoporosis screening: not due -- tobacco screening: not using -- alcohol screening: AUDIT questionnaire indicates low-risk usage. -- family history of breast cancer screening: done. not at high risk. -- no evidence of domestic violence or intimate partner violence. -- STD screening: gonorrhea/chlamydia NAAT collected -- pap smear collected per ASCCP guidelines    Thomasene Mohair, MD 03/07/2021 8:57 AM

## 2021-03-08 LAB — CYTOLOGY - PAP
Chlamydia: NEGATIVE
Comment: NEGATIVE
Comment: NORMAL
Diagnosis: NEGATIVE
Neisseria Gonorrhea: NEGATIVE

## 2021-11-14 ENCOUNTER — Encounter
Admission: EM | Disposition: A | Discharge status: Home / Self Care | Payer: Self-pay | Source: Home / Self Care | Attending: Emergency Medicine

## 2021-11-14 ENCOUNTER — Other Ambulatory Visit: Payer: Self-pay

## 2021-11-14 ENCOUNTER — Emergency Department: Payer: Medicaid Other | Admitting: Anesthesiology

## 2021-11-14 ENCOUNTER — Ambulatory Visit
Admission: EM | Admit: 2021-11-14 | Discharge: 2021-11-14 | Disposition: A | Discharge status: Home / Self Care | Payer: Medicaid Other | Attending: Emergency Medicine | Admitting: Emergency Medicine

## 2021-11-14 ENCOUNTER — Emergency Department: Payer: Medicaid Other

## 2021-11-14 ENCOUNTER — Encounter: Payer: Self-pay | Admitting: Emergency Medicine

## 2021-11-14 DIAGNOSIS — K661 Hemoperitoneum: Secondary | ICD-10-CM | POA: Diagnosis not present

## 2021-11-14 DIAGNOSIS — O00101 Right tubal pregnancy without intrauterine pregnancy: Secondary | ICD-10-CM | POA: Insufficient documentation

## 2021-11-14 DIAGNOSIS — O009 Unspecified ectopic pregnancy without intrauterine pregnancy: Secondary | ICD-10-CM | POA: Diagnosis present

## 2021-11-14 HISTORY — PX: LAPAROSCOPIC UNILATERAL SALPINGECTOMY: SHX5934

## 2021-11-14 LAB — URINALYSIS, ROUTINE W REFLEX MICROSCOPIC
Bilirubin Urine: NEGATIVE
Glucose, UA: NEGATIVE mg/dL
Hgb urine dipstick: NEGATIVE
Ketones, ur: NEGATIVE mg/dL
Leukocytes,Ua: NEGATIVE
Nitrite: NEGATIVE
Protein, ur: NEGATIVE mg/dL
Specific Gravity, Urine: 1.017 (ref 1.005–1.030)
pH: 5 (ref 5.0–8.0)

## 2021-11-14 LAB — CBC
HCT: 39.8 % (ref 36.0–46.0)
Hemoglobin: 12.8 g/dL (ref 12.0–15.0)
MCH: 29.9 pg (ref 26.0–34.0)
MCHC: 32.2 g/dL (ref 30.0–36.0)
MCV: 93 fL (ref 80.0–100.0)
Platelets: 262 10*3/uL (ref 150–400)
RBC: 4.28 MIL/uL (ref 3.87–5.11)
RDW: 13.3 % (ref 11.5–15.5)
WBC: 8.3 10*3/uL (ref 4.0–10.5)
nRBC: 0 % (ref 0.0–0.2)

## 2021-11-14 LAB — CHLAMYDIA/NGC RT PCR (ARMC ONLY)
Chlamydia Tr: NOT DETECTED
N gonorrhoeae: NOT DETECTED

## 2021-11-14 LAB — TYPE AND SCREEN
ABO/RH(D): O POS
Antibody Screen: NEGATIVE

## 2021-11-14 LAB — COMPREHENSIVE METABOLIC PANEL
ALT: 10 U/L (ref 0–44)
AST: 16 U/L (ref 15–41)
Albumin: 3.8 g/dL (ref 3.5–5.0)
Alkaline Phosphatase: 47 U/L (ref 38–126)
Anion gap: 9 (ref 5–15)
BUN: 15 mg/dL (ref 6–20)
CO2: 22 mmol/L (ref 22–32)
Calcium: 8.9 mg/dL (ref 8.9–10.3)
Chloride: 106 mmol/L (ref 98–111)
Creatinine, Ser: 0.71 mg/dL (ref 0.44–1.00)
GFR, Estimated: >60 mL/min (ref >60)
Glucose, Bld: 140 mg/dL — ABNORMAL HIGH (ref 70–99)
Potassium: 3.4 mmol/L — ABNORMAL LOW (ref 3.5–5.1)
Sodium: 137 mmol/L (ref 135–145)
Total Bilirubin: 1.8 mg/dL — ABNORMAL HIGH (ref 0.3–1.2)
Total Protein: 7.2 g/dL (ref 6.5–8.1)

## 2021-11-14 LAB — WET PREP, GENITAL
Clue Cells Wet Prep HPF POC: NONE SEEN
Sperm: NONE SEEN
Trich, Wet Prep: NONE SEEN
WBC, Wet Prep HPF POC: <10 (ref <10)
Yeast Wet Prep HPF POC: NONE SEEN

## 2021-11-14 LAB — HCG, QUANTITATIVE, PREGNANCY: hCG, Beta Chain, Quant, S: 50202 m[IU]/mL — ABNORMAL HIGH (ref <5)

## 2021-11-14 LAB — POC URINE PREG, ED: Preg Test, Ur: POSITIVE — AB

## 2021-11-14 SURGERY — SALPINGECTOMY, UNILATERAL, LAPAROSCOPIC
Anesthesia: General | Laterality: Right

## 2021-11-14 MED ORDER — FENTANYL CITRATE PF 50 MCG/ML IJ SOSY
50.0000 ug | PREFILLED_SYRINGE | Freq: Once | INTRAMUSCULAR | Status: AC
  Administered 2021-11-14: 50 ug via INTRAVENOUS
  Filled 2021-11-14: qty 1

## 2021-11-14 MED ORDER — ONDANSETRON HCL 4 MG/2ML IJ SOLN
INTRAMUSCULAR | Status: AC
Start: 2021-11-14 — End: ?
  Filled 2021-11-14: qty 2

## 2021-11-14 MED ORDER — GABAPENTIN 300 MG PO CAPS
300.0000 mg | ORAL_CAPSULE | Freq: Two times a day (BID) | ORAL | Status: DC
Start: 2021-11-14 — End: 2021-11-14

## 2021-11-14 MED ORDER — ONDANSETRON HCL 4 MG/2ML IJ SOLN: 4.0000 mg | Freq: Four times a day (QID) | INTRAMUSCULAR | Status: DC | PRN

## 2021-11-14 MED ORDER — SUGAMMADEX SODIUM 200 MG/2ML IV SOLN
INTRAVENOUS | Status: DC | PRN
  Administered 2021-11-14: 200 mg via INTRAVENOUS

## 2021-11-14 MED ORDER — ACETAMINOPHEN 10 MG/ML IV SOLN
INTRAVENOUS | Status: AC
  Filled 2021-11-14: qty 100

## 2021-11-14 MED ORDER — LIDOCAINE HCL (CARDIAC) PF 100 MG/5ML IV SOSY
PREFILLED_SYRINGE | INTRAVENOUS | Status: DC | PRN
  Administered 2021-11-14: 60 mg via INTRAVENOUS

## 2021-11-14 MED ORDER — LACTATED RINGERS IV SOLN: INTRAVENOUS | Status: DC | PRN

## 2021-11-14 MED ORDER — CELECOXIB 200 MG PO CAPS: 200.0000 mg | ORAL_CAPSULE | Freq: Two times a day (BID) | ORAL | Status: DC

## 2021-11-14 MED ORDER — 0.9 % SODIUM CHLORIDE (POUR BTL) OPTIME
TOPICAL | Status: DC | PRN
  Administered 2021-11-14: 600 mL

## 2021-11-14 MED ORDER — ROCURONIUM BROMIDE 10 MG/ML (PF) SYRINGE
PREFILLED_SYRINGE | INTRAVENOUS | Status: AC
  Filled 2021-11-14: qty 10

## 2021-11-14 MED ORDER — HYDROCODONE-ACETAMINOPHEN 5-325 MG PO TABS: 1.0000 | ORAL_TABLET | Freq: Four times a day (QID) | ORAL | 0 refills | Status: DC | PRN

## 2021-11-14 MED ORDER — ALBUMIN HUMAN 5 % IV SOLN
INTRAVENOUS | Status: AC
  Filled 2021-11-14: qty 500

## 2021-11-14 MED ORDER — PROPOFOL 10 MG/ML IV BOLUS
INTRAVENOUS | Status: AC
  Filled 2021-11-14: qty 20

## 2021-11-14 MED ORDER — ONDANSETRON HCL 4 MG/2ML IJ SOLN
4.0000 mg | Freq: Once | INTRAMUSCULAR | Status: DC | PRN
Start: 2021-11-14 — End: 2021-11-14

## 2021-11-14 MED ORDER — OXYCODONE-ACETAMINOPHEN 5-325 MG PO TABS: 1.0000 | ORAL_TABLET | ORAL | Status: DC | PRN

## 2021-11-14 MED ORDER — FENTANYL CITRATE (PF) 100 MCG/2ML IJ SOLN
INTRAMUSCULAR | Status: DC | PRN
  Administered 2021-11-14: 50 ug via INTRAVENOUS
  Administered 2021-11-14: 25 ug via INTRAVENOUS
  Administered 2021-11-14: 50 ug via INTRAVENOUS

## 2021-11-14 MED ORDER — FENTANYL CITRATE (PF) 100 MCG/2ML IJ SOLN
INTRAMUSCULAR | Status: AC
  Filled 2021-11-14: qty 2

## 2021-11-14 MED ORDER — GLYCOPYRROLATE 0.2 MG/ML IJ SOLN
INTRAMUSCULAR | Status: DC | PRN
  Administered 2021-11-14: .2 mg via INTRAVENOUS

## 2021-11-14 MED ORDER — PHENYLEPHRINE HCL (PRESSORS) 10 MG/ML IV SOLN
INTRAVENOUS | Status: DC | PRN
  Administered 2021-11-14 (×2): 80 ug via INTRAVENOUS
  Administered 2021-11-14: 160 ug via INTRAVENOUS

## 2021-11-14 MED ORDER — PROPOFOL 10 MG/ML IV BOLUS
INTRAVENOUS | Status: DC | PRN
  Administered 2021-11-14: 30 mg via INTRAVENOUS
  Administered 2021-11-14: 150 mg via INTRAVENOUS

## 2021-11-14 MED ORDER — ROCURONIUM BROMIDE 100 MG/10ML IV SOLN
INTRAVENOUS | Status: DC | PRN
  Administered 2021-11-14: 10 mg via INTRAVENOUS
  Administered 2021-11-14: 40 mg via INTRAVENOUS
  Administered 2021-11-14: 10 mg via INTRAVENOUS

## 2021-11-14 MED ORDER — ACETAMINOPHEN 500 MG PO TABS
1000.0000 mg | ORAL_TABLET | Freq: Once | ORAL | Status: DC
  Filled 2021-11-14: qty 2

## 2021-11-14 MED ORDER — BUPIVACAINE HCL 0.5 % IJ SOLN
INTRAMUSCULAR | Status: DC | PRN
  Administered 2021-11-14: 18 mL

## 2021-11-14 MED ORDER — MIDAZOLAM HCL 2 MG/2ML IJ SOLN
INTRAMUSCULAR | Status: AC
  Filled 2021-11-14: qty 2

## 2021-11-14 MED ORDER — BUPIVACAINE HCL (PF) 0.5 % IJ SOLN
INTRAMUSCULAR | Status: AC
  Filled 2021-11-14: qty 30

## 2021-11-14 MED ORDER — LIDOCAINE HCL (PF) 2 % IJ SOLN
INTRAMUSCULAR | Status: AC
  Filled 2021-11-14: qty 5

## 2021-11-14 MED ORDER — SODIUM CHLORIDE 0.9 % IV BOLUS
1000.0000 mL | Freq: Once | INTRAVENOUS | Status: AC
  Administered 2021-11-14: 1000 mL via INTRAVENOUS

## 2021-11-14 MED ORDER — FENTANYL CITRATE PF 50 MCG/ML IJ SOSY: 50.0000 ug | PREFILLED_SYRINGE | Freq: Once | INTRAMUSCULAR | Status: AC

## 2021-11-14 MED ORDER — DEXAMETHASONE SODIUM PHOSPHATE 10 MG/ML IJ SOLN
INTRAMUSCULAR | Status: AC
  Filled 2021-11-14: qty 1

## 2021-11-14 MED ORDER — DEXAMETHASONE SODIUM PHOSPHATE 10 MG/ML IJ SOLN
INTRAMUSCULAR | Status: DC | PRN
  Administered 2021-11-14: 8 mg via INTRAVENOUS

## 2021-11-14 MED ORDER — PHENYLEPHRINE 80 MCG/ML (10ML) SYRINGE FOR IV PUSH (FOR BLOOD PRESSURE SUPPORT)
PREFILLED_SYRINGE | INTRAVENOUS | Status: AC
  Filled 2021-11-14: qty 10

## 2021-11-14 MED ORDER — FENTANYL CITRATE PF 50 MCG/ML IJ SOSY
PREFILLED_SYRINGE | INTRAMUSCULAR | Status: AC
  Administered 2021-11-14: 50 ug via INTRAVENOUS
  Filled 2021-11-14: qty 1

## 2021-11-14 MED ORDER — ACETAMINOPHEN 10 MG/ML IV SOLN
INTRAVENOUS | Status: DC | PRN
  Administered 2021-11-14: 1000 mg via INTRAVENOUS

## 2021-11-14 MED ORDER — ONDANSETRON 4 MG PO TBDP: 4.0000 mg | ORAL_TABLET | Freq: Four times a day (QID) | ORAL | Status: DC | PRN

## 2021-11-14 MED ORDER — KETOROLAC TROMETHAMINE 30 MG/ML IJ SOLN
INTRAMUSCULAR | Status: DC
Start: 2021-11-14 — End: 2021-11-14
  Filled 2021-11-14: qty 1

## 2021-11-14 MED ORDER — DEXAMETHASONE SODIUM PHOSPHATE 10 MG/ML IJ SOLN
INTRAMUSCULAR | Status: AC
Start: 2021-11-14 — End: ?
  Filled 2021-11-14: qty 1

## 2021-11-14 MED ORDER — SILVER NITRATE-POT NITRATE 75-25 % EX MISC
CUTANEOUS | Status: AC
  Filled 2021-11-14: qty 40

## 2021-11-14 MED ORDER — IBUPROFEN 600 MG PO TABS: 600.0000 mg | ORAL_TABLET | Freq: Four times a day (QID) | ORAL | 0 refills | Status: DC | PRN

## 2021-11-14 MED ORDER — ALBUMIN HUMAN 5 % IV SOLN: INTRAVENOUS | Status: DC | PRN

## 2021-11-14 MED ORDER — MIDAZOLAM HCL 2 MG/2ML IJ SOLN
INTRAMUSCULAR | Status: DC | PRN
  Administered 2021-11-14: 2 mg via INTRAVENOUS

## 2021-11-14 MED ORDER — DEXTROSE IN LACTATED RINGERS 5 % IV SOLN: INTRAVENOUS | Status: DC

## 2021-11-14 MED ORDER — FENTANYL CITRATE (PF) 100 MCG/2ML IJ SOLN: 25.0000 ug | INTRAMUSCULAR | Status: DC | PRN

## 2021-11-14 MED ORDER — ONDANSETRON HCL 4 MG/2ML IJ SOLN
INTRAMUSCULAR | Status: DC | PRN
  Administered 2021-11-14: 4 mg via INTRAVENOUS

## 2021-11-14 MED ORDER — KETOROLAC TROMETHAMINE 30 MG/ML IJ SOLN
30.0000 mg | Freq: Once | INTRAMUSCULAR | Status: AC
  Administered 2021-11-14: 30 mg via INTRAVENOUS

## 2021-11-14 SURGICAL SUPPLY — 49 items
ANCHOR TIS RET SYS 235ML (MISCELLANEOUS) ×2 IMPLANT
BACTOSHIELD CHG 4% 4OZ (MISCELLANEOUS) ×1
BAG DECANTER FOR FLEXI CONT (MISCELLANEOUS) ×3 IMPLANT
BLADE SURG 15 STRL LF DISP TIS (BLADE) ×2 IMPLANT
BLADE SURG 15 STRL SS (BLADE) ×1
BLADE SURG SZ11 CARB STEEL (BLADE) ×3 IMPLANT
CATH ROBINSON RED A/P 16FR (CATHETERS) ×3 IMPLANT
CHLORAPREP W/TINT 26 (MISCELLANEOUS) ×3 IMPLANT
ELECT REM PT RETURN 9FT ADLT (ELECTROSURGICAL) ×3
ELECTRODE REM PT RTRN 9FT ADLT (ELECTROSURGICAL) ×2 IMPLANT
GAUZE 4X4 16PLY ~~LOC~~+RFID DBL (SPONGE) ×3 IMPLANT
GLOVE BIO SURGEON STRL SZ8 (GLOVE) ×3 IMPLANT
GLOVE BIOGEL PI ORTHO PRO 7.5 (GLOVE) ×1
GLOVE PI ORTHO PRO STRL 7.5 (GLOVE) ×2 IMPLANT
GOWN STRL REUS W/ TWL LRG LVL3 (GOWN DISPOSABLE) ×4 IMPLANT
GOWN STRL REUS W/TWL LRG LVL3 (GOWN DISPOSABLE) ×2
GOWN STRL REUS W/TWL XL LVL4 (GOWN DISPOSABLE) ×3 IMPLANT
GRASPER SUT TROCAR 14GX15 (MISCELLANEOUS) IMPLANT
IRRIGATION STRYKERFLOW (MISCELLANEOUS) ×1 IMPLANT
IRRIGATOR STRYKERFLOW (MISCELLANEOUS) ×3
IV LACTATED RINGERS 1000ML (IV SOLUTION) ×3 IMPLANT
KIT PINK PAD W/HEAD ARE REST (MISCELLANEOUS) ×3 IMPLANT
KIT PINK PAD W/HEAD ARM REST (MISCELLANEOUS) ×2 IMPLANT
KIT TURNOVER CYSTO (KITS) ×3 IMPLANT
LIGASURE LAP MARYLAND 5MM 37CM (ELECTROSURGICAL) ×2 IMPLANT
MANIFOLD NEPTUNE II (INSTRUMENTS) ×3 IMPLANT
NEEDLE HYPO 22GX1.5 SAFETY (NEEDLE) ×3 IMPLANT
NS IRRIG 500ML POUR BTL (IV SOLUTION) ×3 IMPLANT
PACK GYN LAPAROSCOPIC (MISCELLANEOUS) ×3 IMPLANT
PAD OB MATERNITY 4.3X12.25 (PERSONAL CARE ITEMS) ×3 IMPLANT
PAD PREP 24X41 OB/GYN DISP (PERSONAL CARE ITEMS) ×3 IMPLANT
SCRUB CHG 4% DYNA-HEX 4OZ (MISCELLANEOUS) ×2 IMPLANT
SET TUBE SMOKE EVAC HIGH FLOW (TUBING) ×3 IMPLANT
SLEEVE ENDOPATH XCEL 5M (ENDOMECHANICALS) ×2 IMPLANT
STRIP CLOSURE SKIN 1/2X4 (GAUZE/BANDAGES/DRESSINGS) ×3 IMPLANT
SUT VIC AB 3-0 SH 27 (SUTURE)
SUT VIC AB 3-0 SH 27X BRD (SUTURE) IMPLANT
SUT VICRYL 0 AB UR-6 (SUTURE) ×3 IMPLANT
SUT VICRYL 4-0  27 PS-2 BARIAT (SUTURE) ×1
SUT VICRYL 4-0 27 PS-2 BARIAT (SUTURE) ×2
SUTURE VICRYL 4-0 27 PS-2 BART (SUTURE) ×2 IMPLANT
SYS BAG RETRIEVAL 10MM (BASKET) ×3
SYSTEM BAG RETRIEVAL 10MM (BASKET) ×2 IMPLANT
TOWEL OR 17X26 4PK STRL BLUE (TOWEL DISPOSABLE) ×3 IMPLANT
TROCAR ENDO BLADELESS 11MM (ENDOMECHANICALS) ×2 IMPLANT
TROCAR XCEL NON-BLD 5MMX100MML (ENDOMECHANICALS) ×3 IMPLANT
TROCAR XCEL UNIV SLVE 11M 100M (ENDOMECHANICALS) IMPLANT
TUBING CONNECTING 10 (TUBING) ×3 IMPLANT
WATER STERILE IRR 500ML POUR (IV SOLUTION) ×3 IMPLANT

## 2021-11-14 NOTE — ED Notes (Signed)
Pt to toilet with mother and this RN. Patient unable to pee and endoring increaed pain. Pt attempted to ambulate to room and had syncopal even, pt was caught by this rn and mother, pt assisted back to bed via wheelchair. Pt resting in bed at this time.

## 2021-11-14 NOTE — ED Provider Notes (Signed)
[facility] Provider Note    Event Date/Time   First MD Initiated Contact with Patient 11/14/21 [phone]     (approximate)   History   Abdominal Pain   HPI  Karen May is a 26 y.o. female  G3P2 who comes in with concerns for pregnancy and abdominal cramping.  Patient reports severe abdominal cramping that started yesterday but progressively getting worse.  Over the abdomen more on the right side.  She does report prior miscarriage and does have a 66-year-old.  Denies any history of any abdominal surgeries or C-sections.  She denies any vaginal discharge or vaginal bleeding.  She does report that she is still breast-feeding a 66-year-old.  She reports having a positive pregnancy test about a week ago.  She reports that she thinks she is about 5 weeks based on her LMP but her periods were irregular given that she was breast-feeding    Physical Exam   Triage Vital Signs: ED Triage Vitals  Enc Vitals Group     BP 11/14/21 0843 93/73     Pulse Rate 11/14/21 0843 72     Resp 11/14/21 0843 16     Temp 11/14/21 0843 98 F (36.7 C)     Temp Source 11/14/21 0843 Oral     SpO2 11/14/21 0843 100 %     Weight 11/14/21 0844 160 lb (72.6 kg)     Height 11/14/21 0844 5\' 7"  (1.702 m)     Head Circumference --      Peak Flow --      Pain Score 11/14/21 0844 10     Pain Loc --      Pain Edu? --      Excl. in GC? --     Most recent vital signs: Vitals:   11/14/21 0843  BP: 93/73  Pulse: 72  Resp: 16  Temp: 98 F (36.7 C)  SpO2: 100%     General: Awake, no distress.  CV:  Good peripheral perfusion.  Resp:  Normal effort.  Abd:  No distention.  Other:  Some tenderness in the lower abdomen   ED Results / Procedures / Treatments   Labs (all labs ordered are listed, but only abnormal results are displayed) Labs Reviewed  COMPREHENSIVE METABOLIC PANEL - Abnormal; Notable for the following components:      Result Value   Potassium 3.4 (*)     Glucose, Bld 140 (*)    Total Bilirubin 1.8 (*)    All other components within normal limits  WET PREP, GENITAL  CHLAMYDIA/NGC RT PCR ([facility] ONLY)            CBC  URINALYSIS, ROUTINE W REFLEX MICROSCOPIC  HCG, QUANTITATIVE, PREGNANCY  POC URINE PREG, ED     EKG  My interpretation of EKG:  Normal sinus rate of 71 without any ST elevations or T wave version aVL, normal intervals  RADIOLOGY I have reviewed the ultrasound personally interpreted and patient does have ruptured ectopic pregnancy  PROCEDURES:  Critical Care performed: Yes, see critical care procedure note(s)  .Critical Care  Performed by: 11/16/21, MD Authorized by: Concha Se, MD   Critical care provider statement:    Critical care time (minutes):  30   Critical care was necessary to treat or prevent imminent or life-threatening deterioration of the following conditions: rupture ectopic preg.   Critical care was time spent personally by me on the following activities:  Development of treatment plan with patient  or surrogate, discussions with consultants, evaluation of patient's response to treatment, examination of patient, ordering and review of laboratory studies, ordering and review of radiographic studies, ordering and performing treatments and interventions, pulse oximetry, re-evaluation of patient's condition and review of old charts .1-3 Lead EKG Interpretation  Performed by: Concha Se, MD Authorized by: Concha Se, MD     Interpretation: normal     ECG rate:  60   ECG rate assessment: normal     Rhythm: sinus rhythm     Ectopy: none     Conduction: normal      MEDICATIONS ORDERED IN ED: Medications  fentaNYL (SUBLIMAZE) injection 50 mcg (has no administration in time range)  acetaminophen (TYLENOL) tablet 1,000 mg (has no administration in time range)     IMPRESSION / MDM / ASSESSMENT AND PLAN / ED COURSE  I reviewed the triage vital signs and the nursing notes.   Patient's  presentation is most consistent with acute presentation with potential threat to life or bodily function.   Differential includes ectopic pregnancy, miscarriage, appendicitis.  Given unclear exactly her last LMP I did do a bedside ultrasound to make sure that she was not super far along and I was concerned that it potentially saw some free fluid therefore I have called ultrasound to get ultrasound done immediately to rule out ectopic pregnancy at this time her blood pressure stable and hemoglobin is stable so can wait for ultrasound.  She did get very weak when in the bathroom and had near syncopal episode but did not fall to the ground or hit her head.  Her pregnancy test did come back positive and I have again called ultrasound to facilitate ultrasound.  Pregnancy test was positive.  Wet prep was negative.  CMP overall reassuring.  CBC shows stable hemoglobin.  11:10 AM formal ultrasound does show evidence of  ectopic pregnancy unclear if rupture given small to moder complex fluid but does have Heart beat of 150 therefore I will consult OB/GYN Dr. Logan Bores.  Discussed with Dr. Logan Bores who plans to take patient to the OR.  I have updated the patient on this plan.  Patient given additional dose of IV fentanyl.  At this time her vital signs are stable but we will continue to closely monitor.  Patient will be admitted to OB/GYN  The patient is on the cardiac monitor to evaluate for evidence of arrhythmia and/or significant heart rate changes.      FINAL CLINICAL IMPRESSION(S) / ED DIAGNOSES   Final diagnoses:  Ruptured ectopic pregnancy     Rx / DC Orders   ED Discharge Orders     None        Note:  This document was prepared using Dragon voice recognition software and may include unintentional dictation errors.   Concha Se, MD 11/14/21 1133

## 2021-11-14 NOTE — ED Notes (Signed)
Pt in and out cath for urine, output . Pt in bed, mother at bedside.

## 2021-11-14 NOTE — ED Notes (Signed)
Pt family endorsing pt is in extreme  pain at this time. Md aware

## 2021-11-14 NOTE — Anesthesia Procedure Notes (Signed)
Procedure Name: Intubation Date/Time: 11/14/2021 2:17 PM  Performed by: Karoline Caldwell, CRNAPre-anesthesia Checklist: Patient identified, Patient being monitored, Timeout performed, Emergency Drugs available and Suction available Patient Re-evaluated:Patient Re-evaluated prior to induction Oxygen Delivery Method: Circle system utilized Preoxygenation: Pre-oxygenation with 100% oxygen Induction Type: IV induction Ventilation: Mask ventilation without difficulty Laryngoscope Size: 3 and McGraph Grade View: Grade I Tube type: Oral Tube size: 7.0 mm Number of attempts: 1 Airway Equipment and Method: Stylet Placement Confirmation: ETT inserted through vocal cords under direct vision, positive ETCO2 and breath sounds checked- equal and bilateral Secured at: 21 cm Tube secured with: Tape Dental Injury: Teeth and Oropharynx as per pre-operative assessment

## 2021-11-14 NOTE — Transfer of Care (Signed)
Immediate Anesthesia Transfer of Care Note  Patient: Karen May  Procedure(s) Performed: LAPAROSCOPIC UNILATERAL SALPINGECTOMY (Right)  Patient Location: PACU  Anesthesia Type:General  Level of Consciousness: awake, alert  and oriented  Airway & Oxygen Therapy: Patient Spontanous Breathing  Post-op Assessment: Report given to RN and Post -op Vital signs reviewed and stable  Post vital signs: Reviewed and stable  Last Vitals:  Vitals Value Taken Time  BP 101/56 11/14/21 1604  Temp 36.8 C 11/14/21 1604  Pulse 81 11/14/21 1604  Resp 20 11/14/21 1604  SpO2 99 % 11/14/21 1604  Vitals shown include unvalidated device data.  Last Pain:  Vitals:   11/14/21 1604  TempSrc:   PainSc: 0-No pain         Complications: No notable events documented.

## 2021-11-14 NOTE — Anesthesia Preprocedure Evaluation (Signed)
Anesthesia Evaluation  Patient identified by MRN, date of birth, ID band Patient awake    Reviewed: Allergy & Precautions, NPO status , Patient's Chart, lab work & pertinent test results  Airway Mallampati: II  TM Distance: >3 FB Neck ROM: Full    Dental  (+) Teeth Intact   Pulmonary neg pulmonary ROS,    Pulmonary exam normal        Cardiovascular negative cardio ROS Normal cardiovascular exam Rhythm:Regular     Neuro/Psych Anxiety Depression negative neurological ROS  negative psych ROS   GI/Hepatic negative GI ROS, Neg liver ROS,   Endo/Other  negative endocrine ROS  Renal/GU negative Renal ROS  negative genitourinary   Musculoskeletal   Abdominal Normal abdominal exam  (+)   Peds negative pediatric ROS (+)  Hematology negative hematology ROS (+)   Anesthesia Other Findings Past Medical History: No date: Anemia No date: Anxiety 2010: Eczema No date: Environmental allergies  Past Surgical History: No date: WISDOM TOOTH EXTRACTION  BMI    Body Mass Index: 25.06 kg/m      Reproductive/Obstetrics negative OB ROS                             Anesthesia Physical Anesthesia Plan  ASA: 2 and emergent  Anesthesia Plan: General   Post-op Pain Management:    Induction: Intravenous  PONV Risk Score and Plan: 1 and Ondansetron and Dexamethasone  Airway Management Planned: Oral ETT  Additional Equipment:   Intra-op Plan:   Post-operative Plan:   Informed Consent: I have reviewed the patients History and Physical, chart, labs and discussed the procedure including the risks, benefits and alternatives for the proposed anesthesia with the patient or authorized representative who has indicated his/her understanding and acceptance.     Dental Advisory Given  Plan Discussed with: CRNA and Surgeon  Anesthesia Plan Comments:         Anesthesia Quick Evaluation

## 2021-11-14 NOTE — ED Triage Notes (Signed)
Says abdominal cramping. No bleeding.  Pregnant.

## 2021-11-14 NOTE — H&P (Signed)
@LOGO @      PRE-OPERATIVE HISTORY AND PHYSICAL EXAM  PCP:  Patient, No Pcp Per Subjective:   HPI:  Karen May is a 26 y.o. G2P1011.  Patient's last menstrual period was 10/06/2021.  She presents today from the ED.  She has the following symptoms:  Abdominal pain worsening -  Tubal ectopic pregnancy  Review of Systems:   Constitutional: Denied constitutional symptoms, night sweats, recent illness, fatigue, fever, insomnia and weight loss.  Eyes: Denied eye symptoms, eye pain, photophobia, vision change and visual disturbance.  Ears/Nose/Throat/Neck: Denied ear, nose, throat or neck symptoms, hearing loss, nasal discharge, sinus congestion and sore throat.  Cardiovascular: Denied cardiovascular symptoms, arrhythmia, chest pain/pressure, edema, exercise intolerance, orthopnea and palpitations.  Respiratory: Denied pulmonary symptoms, asthma, pleuritic pain, productive sputum, cough, dyspnea and wheezing.  Gastrointestinal: Denied, gastro-esophageal reflux, melena, nausea and vomiting.  Genitourinary: Crampy abdominal pain  Musculoskeletal: Denied musculoskeletal symptoms, stiffness, swelling, muscle weakness and myalgia.  Dermatologic: Denied dermatology symptoms, rash and scar.  Neurologic: Denied neurology symptoms, dizziness, headache, neck pain and syncope.  Psychiatric: Denied psychiatric symptoms, anxiety and depression.  Endocrine: Denied endocrine symptoms including hot flashes and night sweats.   OB History  Gravida Para Term Preterm AB Living  2 1 1  0 1 1  SAB IAB Ectopic Multiple Live Births  1 0 0 0 1    # Outcome Date GA Lbr Len/2nd Weight Sex Delivery Anes PTL Lv  2 Term 12/29/19 [redacted]w[redacted]d / 00:08 2950 g M Vag-Spont EPI  LIV  1 SAB      SAB       Past Medical History:  Diagnosis Date   Anemia    Anxiety    Eczema 2010   Environmental allergies     Past Surgical History:  Procedure Laterality Date   WISDOM TOOTH EXTRACTION        SOCIAL  HISTORY:  Social History   Tobacco Use  Smoking Status Never  Smokeless Tobacco Never   Social History   Substance and Sexual Activity  Alcohol Use Not Currently   Comment: occ    Social History   Substance and Sexual Activity  Drug Use Not Currently    Family History  Problem Relation Age of Onset   Lung cancer Maternal Grandmother    Diabetes Paternal Grandmother    Breast cancer Maternal Aunt 23       maternal great aunt   Cancer Cousin    Breast cancer Cousin 27   Schizophrenia Maternal Aunt    Diabetes Cousin     ALLERGIES:  Patient has no known allergies.  MEDS:   No current facility-administered medications on file prior to encounter.   Current Outpatient Medications on File Prior to Encounter  Medication Sig Dispense Refill   acetaminophen (TYLENOL) 325 MG tablet Take 2 tablets (650 mg total) by mouth every 4 (four) hours as needed for mild pain or headache (for pain scale < 4). (Patient not taking: Reported on 03/07/2021)     Hydrocortisone, Perianal, (PROCTOCORT) 1 % CREA Apply 1 applicator topically in the morning and at bedtime. (Patient not taking: No sig reported) 28 g 0   ibuprofen (ADVIL) 600 MG tablet Take 1 tablet (600 mg total) by mouth every 6 (six) hours as needed for moderate pain or cramping. (Patient not taking: No sig reported) 30 tablet 1   norethindrone (ERRIN) 0.35 MG tablet Take 1 tablet (0.35 mg total) by mouth daily. (Patient not taking: Reported on 03/07/2021) 28 tablet  11   Prenat-Fe Carbonyl-FA-Omega 3 (ONE-A-DAY WOMENS PRENATAL 1) 28-0.8-235 MG CAPS Take by mouth. (Patient not taking: Reported on 03/07/2021)      Meds ordered this encounter  Medications   fentaNYL (SUBLIMAZE) injection 50 mcg   acetaminophen (TYLENOL) tablet 1,000 mg   sodium chloride 0.9 % bolus 1,000 mL   fentaNYL (SUBLIMAZE) injection 50 mcg   fentaNYL (SUBLIMAZE) injection 50 mcg   fentaNYL (SUBLIMAZE) 50 MCG/ML injection    Hopkins, Erika W: cabinet  override     Physical examination BP (!) 107/53   Pulse 75   Temp 98.8 F (37.1 C) (Oral)   Resp 16   Ht 5\' 6"  (1.676 m)   Wt 72.6 kg   LMP 10/06/2021   SpO2 100%   BMI 25.82 kg/m   General NAD, Conversant  HEENT Atraumatic; Op clear with mmm.  Normo-cephalic. Pupils reactive. Anicteric sclerae  Thyroid/Neck Smooth without nodularity or enlargement. Normal ROM.  Neck Supple.  Skin No rashes, lesions or ulceration. Normal palpated skin turgor. No nodularity.  Breasts:   Lungs: Clear to auscultation.No rales or wheezes. Normal Respiratory effort, no retractions.  Heart: NSR.  No murmurs or rubs appreciated. No periferal edema  Abdomen: Soft.  Mildly tender  Extremities: Moves all appropriately.  Normal ROM for age. No lymphadenopathy.  Neuro: Oriented to PPT.  Normal mood. Normal affect.     Pelvic: Deferred to OR   Assessment:   G2P1011 Patient Active Problem List   Diagnosis Date Noted   Single liveborn infant, delivered vaginally    Postpartum care following vaginal delivery    Encounter for care or examination of lactating mother    Normal labor and delivery 12/28/2019   Hemorrhoids during pregnancy 12/03/2019   Anxiety and depression 05/19/2019   Vaginal bleeding 02/26/2019   Supervision of low-risk first pregnancy, unspecified trimester 09/25/2018   Marijuana use 09/25/2018    Tubal ectopic pregnancy - Unknown regarding rupture    Plan:   Orders: Meds ordered this encounter  Medications   fentaNYL (SUBLIMAZE) injection 50 mcg   acetaminophen (TYLENOL) tablet 1,000 mg   sodium chloride 0.9 % bolus 1,000 mL   fentaNYL (SUBLIMAZE) injection 50 mcg   fentaNYL (SUBLIMAZE) injection 50 mcg   fentaNYL (SUBLIMAZE) 50 MCG/ML injection    Hopkins, Erika W: cabinet override     1.  Exploratory Lpy with removal of ectopic pregnancy.  Possible salpingectomy.   M, M.D. 11/14/2021 1:53 PM

## 2021-11-14 NOTE — Discharge Instructions (Addendum)
AMBULATORY SURGERY  DISCHARGE INSTRUCTIONS   The drugs that you were given will stay in your system until tomorrow so for the next 24 hours you should not:  Drive an automobile Make any legal decisions Drink any alcoholic beverage   You may resume regular meals tomorrow.  Today it is better to start with liquids and gradually work up to solid foods.  You may eat anything you prefer, but it is better to start with liquids, then soup and crackers, and gradually work up to solid foods.   Please notify your doctor immediately if you have any unusual bleeding, trouble breathing, redness and pain at the surgery site, drainage, fever, or pain not relieved by medication.     Your post-operative visit with Dr.    Logan Bores                                   is: Date:      12/01/2021                  Time:  10:30am

## 2021-11-14 NOTE — Op Note (Signed)
@  LOGO@    OPERATIVE NOTE 11/14/2021 3:55 PM  PRE-OPERATIVE DIAGNOSIS:  1) Right Ruptured Tubal Ectopic pregnancy  POST-OPERATIVE DIAGNOSIS:  1) Same with hemoperitoneum  OPERATION:  LAPAROSCOPIC UNILATERAL SALPINGECTOMY: 51025 (CPT)  SURGEON(S): Surgeon(s) and Role:    Linzie Collin, MD - Primary   ANESTHESIA: * No anesthesia type entered *  ESTIMATED BLOOD LOSS: 1000 mL  OPERATIVE FINDINGS: Large dialted right Tube c/w ectopic pregnancy.  hemoperitoneum.   SPECIMEN:  ID Type Source Tests Collected by Time Destination  1 : fallopian tube, right Tissue PATH Gyn biopsy SURGICAL PATHOLOGY Linzie Collin, MD 11/14/2021 1503     COMPLICATIONS: None  DISPOSITION: Stable to recovery room  DESCRIPTION OF PROCEDURE:      The patient was prepped and draped in the dorsolithotomy position and placed under general anesthesia. The bladder was emptied. The cervix was grasped with a multi-toothed tenaculum and a uterine manipulator was placed within the cervical os respecting the position and curvature of the uterus. After changing gloves we proceeded abdominally. A small infraumbilical incision was made and a 5 mm trocar port was placed within the abdominopelvic cavity. The opening pressure was less than 7 mmHg.  Approximately 3 and 1/2 L of carbon dioxide gas was instilled within the abdominal pelvic cavity. The laparoscope was placed and the pelvis and abdomen were carefully inspected. Hemoperitoneum was immediately encountered.  Right and left ports were placed under direct visualization the left lower quadrant port was a 10 mm port.  We then suctioned blood and clot from the abdomen and pelvis until we are able to visualize the right fallopian tube which was very largely dilated and actively bleeding.  The decision was made to remove this tube.  The LigaSure was placed and the tube was systematically coagulated and divided to remove the ectopic pregnancy still located within  the tube.  An Endo Catch bag was placed in the left lower quadrant port and the tube with the ectopic pregnancy was placed within the bag.  The bag was removed from the left lower quadrant port. Systematic irrigation and suction was performed until most of the blood was removed.  The patient was placed in reverse Trendelenburg and the pelvis was again completely suctioned. Hemostasis of all areas of the pelvis was noted. The ports were removed under direct visualization.  Hemostasis was noted.  The left lower quadrant port site was closed with a deep suture using the PMI through the fascia.  The other port sites and the left lower quadrant port site was then closed with subcuticular sutures.  A long-acting anesthetic was injected.  Dermabond was applied.  The uterine manipulator was removed. Hemostasis of the cervix was noted. The patient went to the recovery room in stable condition.  Elonda Husky, M.D. 11/14/2021 3:55 PM

## 2021-11-15 ENCOUNTER — Encounter: Payer: Self-pay | Admitting: Obstetrics and Gynecology

## 2021-11-16 LAB — SURGICAL PATHOLOGY

## 2021-12-01 ENCOUNTER — Ambulatory Visit (INDEPENDENT_AMBULATORY_CARE_PROVIDER_SITE_OTHER): Payer: Medicaid Other | Admitting: Obstetrics and Gynecology

## 2021-12-01 ENCOUNTER — Encounter: Payer: Self-pay | Admitting: Obstetrics and Gynecology

## 2021-12-01 ENCOUNTER — Encounter: Payer: Medicaid Other | Admitting: Obstetrics and Gynecology

## 2021-12-01 VITALS — BP 116/73 | HR 60 | Ht 66.0 in | Wt 158.7 lb

## 2021-12-01 DIAGNOSIS — Z9889 Other specified postprocedural states: Secondary | ICD-10-CM

## 2021-12-01 DIAGNOSIS — O00101 Right tubal pregnancy without intrauterine pregnancy: Secondary | ICD-10-CM

## 2021-12-01 DIAGNOSIS — Z30011 Encounter for initial prescription of contraceptive pills: Secondary | ICD-10-CM

## 2021-12-01 MED ORDER — DESOGESTREL-ETHINYL ESTRADIOL 0.15-0.02/0.01 MG (21/5) PO TABS: 1.0000 | ORAL_TABLET | Freq: Every day | ORAL | 1 refills | Status: DC

## 2021-12-01 NOTE — Progress Notes (Signed)
HPI:      [name] is a 26 y.o. G2P1011 who LMP was Patient's last menstrual period was 10/06/2021.  Subjective:   She presents today 2 weeks postop from ruptured ectopic pregnancy.  She reports she is doing well.  Eating voiding and having bowel movements without issue.  She has occasional pain but nothing severe. She would like to start combination OCPs for birth control.  She is still breast-feeding but plans to wean in the next 2 weeks.    Hx: The following portions of the patient's history were reviewed and updated as appropriate:             She  has a past medical history of Anemia, Anxiety, Eczema (2010), and Environmental allergies. She does not have any pertinent problems on file. She  has a past surgical history that includes Wisdom tooth extraction and Laparoscopic unilateral salpingectomy (Right, 11/14/2021). Her family history includes Breast cancer (age of onset: 84) in her cousin; Breast cancer (age of onset: 2) in her maternal aunt; Cancer in her cousin; Diabetes in her cousin and paternal grandmother; Lung cancer in her maternal grandmother; Schizophrenia in her maternal aunt. She  reports that she has never smoked. She has never used smokeless tobacco. She reports that she does not currently use alcohol. She reports that she does not currently use drugs. She has a current medication list which includes the following prescription(s): desogestrel-ethinyl estradiol and ibuprofen. She has No Known Allergies.       Review of Systems:  Review of Systems  Constitutional: Denied constitutional symptoms, night sweats, recent illness, fatigue, fever, insomnia and weight loss.  Eyes: Denied eye symptoms, eye pain, photophobia, vision change and visual disturbance.  Ears/Nose/Throat/Neck: Denied ear, nose, throat or neck symptoms, hearing loss, nasal discharge, sinus congestion and sore throat.  Cardiovascular: Denied cardiovascular symptoms, arrhythmia, chest  pain/pressure, edema, exercise intolerance, orthopnea and palpitations.  Respiratory: Denied pulmonary symptoms, asthma, pleuritic pain, productive sputum, cough, dyspnea and wheezing.  Gastrointestinal: Denied, gastro-esophageal reflux, melena, nausea and vomiting.  Genitourinary: Denied genitourinary symptoms including symptomatic vaginal discharge, pelvic relaxation issues, and urinary complaints.  Musculoskeletal: Denied musculoskeletal symptoms, stiffness, swelling, muscle weakness and myalgia.  Dermatologic: Denied dermatology symptoms, rash and scar.  Neurologic: Denied neurology symptoms, dizziness, headache, neck pain and syncope.  Psychiatric: Denied psychiatric symptoms, anxiety and depression.  Endocrine: Denied endocrine symptoms including hot flashes and night sweats.   Meds:   Current Outpatient Medications on File Prior to Visit  Medication Sig Dispense Refill   ibuprofen (ADVIL) 600 MG tablet Take 1 tablet (600 mg total) by mouth every 6 (six) hours as needed. 60 tablet 0   No current facility-administered medications on file prior to visit.      Objective:     Vitals:   12/01/21 1453  BP: 116/73  Pulse: 60   Filed Weights   12/01/21 1453  Weight: 158 lb 11.2 oz (72 kg)               Abdomen: Soft.  Non-tender.  No masses.  No HSM.  Incision/s: Intact.  Healing well.  No erythema.  No drainage.             Assessment:    G2P1011 Patient Active Problem List   Diagnosis Date Noted   Single liveborn infant, delivered vaginally    Postpartum care following vaginal delivery    Encounter for care or examination of lactating mother    Normal labor and delivery  12/28/2019   Hemorrhoids during pregnancy 12/03/2019   Anxiety and depression 05/19/2019   Vaginal bleeding 02/26/2019   Supervision of low-risk first pregnancy, unspecified trimester 09/25/2018   Marijuana use 09/25/2018     1. Postoperative state   2. Initiation of OCP (BCP)     Excellent  recovery status post ectopic pregnancy  Desires OCPs   Plan:            1.  Wound care discussed  2.  Begin OCPs in 2 weeks OCPs The risks /benefits of OCPs have been explained to the patient in detail.  Product literature has been given to her where appropriate.  I have instructed her in the use of OCPs.  I have explained to the patient that OCPs are not as effective for birth control during the first month of use, and that another form of contraception should be used during this time.  Both first-day start and Sunday start have been explained.  The risks and benefits of each was discussed.  She has been made aware of  the fact that in rare circumstances, other medications may affect the efficacy of OCPs.  I have answered all of her questions, and I believe that she has an understanding of the effectiveness and use of OCPs. We will start Mircette  Orders No orders of the defined types were placed in this encounter.    Meds ordered this encounter  Medications   desogestrel-ethinyl estradiol (MIRCETTE) 0.15-0.02/0.01 MG (21/5) tablet    Sig: Take 1 tablet by mouth at bedtime.    Dispense:  84 tablet    Refill:  1      F/U  Return in about 4 weeks (around 12/29/2021).  Elonda Husky, M.D. 12/01/2021 2:58 PM

## 2021-12-01 NOTE — Progress Notes (Signed)
Patient presents for 2 week post-op follow-up. She states her bleeding has resolved but incisions are still tender.She states an interest in OCP's. No other concerns at this time.

## 2021-12-28 ENCOUNTER — Ambulatory Visit (INDEPENDENT_AMBULATORY_CARE_PROVIDER_SITE_OTHER): Payer: Medicaid Other | Admitting: Obstetrics and Gynecology

## 2021-12-28 ENCOUNTER — Encounter: Payer: Self-pay | Admitting: Obstetrics and Gynecology

## 2021-12-28 VITALS — BP 97/64 | HR 79 | Ht 66.0 in | Wt 159.5 lb

## 2021-12-28 DIAGNOSIS — Z9889 Other specified postprocedural states: Secondary | ICD-10-CM

## 2021-12-28 NOTE — Progress Notes (Signed)
HPI:      [name] is a 26 y.o. G2P1011 who LMP was No LMP recorded.  Subjective:   She presents today approximately 6 weeks from right ruptured ectopic pregnancy.  She had hemoperitoneum.  Her right fallopian tube was removed.  She reports that she is currently doing well.  Has resumed normal activities. She would like to start OCPs for birth control.  She states that she has some packs at home and will start those. She is weaned from breast-feeding except 1 time at night.    Hx: The following portions of the patient's history were reviewed and updated as appropriate:             She  has a past medical history of Anemia, Anxiety, Eczema (2010), and Environmental allergies. She does not have any pertinent problems on file. She  has a past surgical history that includes Wisdom tooth extraction and Laparoscopic unilateral salpingectomy (Right, 11/14/2021). Her family history includes Breast cancer (age of onset: 35) in her cousin; Breast cancer (age of onset: 54) in her maternal aunt; Cancer in her cousin; Diabetes in her cousin and paternal grandmother; Lung cancer in her maternal grandmother; Schizophrenia in her maternal aunt. She  reports that she has never smoked. She has never used smokeless tobacco. She reports that she does not currently use alcohol. She reports that she does not currently use drugs. She has a current medication list which includes the following prescription(s): desogestrel-ethinyl estradiol and ibuprofen. She has No Known Allergies.       Review of Systems:  Review of Systems  Constitutional: Denied constitutional symptoms, night sweats, recent illness, fatigue, fever, insomnia and weight loss.  Eyes: Denied eye symptoms, eye pain, photophobia, vision change and visual disturbance.  Ears/Nose/Throat/Neck: Denied ear, nose, throat or neck symptoms, hearing loss, nasal discharge, sinus congestion and sore throat.  Cardiovascular: Denied cardiovascular  symptoms, arrhythmia, chest pain/pressure, edema, exercise intolerance, orthopnea and palpitations.  Respiratory: Denied pulmonary symptoms, asthma, pleuritic pain, productive sputum, cough, dyspnea and wheezing.  Gastrointestinal: Denied, gastro-esophageal reflux, melena, nausea and vomiting.  Genitourinary: Denied genitourinary symptoms including symptomatic vaginal discharge, pelvic relaxation issues, and urinary complaints.  Musculoskeletal: Denied musculoskeletal symptoms, stiffness, swelling, muscle weakness and myalgia.  Dermatologic: Denied dermatology symptoms, rash and scar.  Neurologic: Denied neurology symptoms, dizziness, headache, neck pain and syncope.  Psychiatric: Denied psychiatric symptoms, anxiety and depression.  Endocrine: Denied endocrine symptoms including hot flashes and night sweats.   Meds:   Current Outpatient Medications on File Prior to Visit  Medication Sig Dispense Refill   desogestrel-ethinyl estradiol (MIRCETTE) 0.15-0.02/0.01 MG (21/5) tablet Take 1 tablet by mouth at bedtime. 84 tablet 1   ibuprofen (ADVIL) 600 MG tablet Take 1 tablet (600 mg total) by mouth every 6 (six) hours as needed. 60 tablet 0   No current facility-administered medications on file prior to visit.      Objective:     Vitals:   12/28/21 1507  BP: 97/64  Pulse: 79   Filed Weights   12/28/21 1507  Weight: 159 lb 8 oz (72.3 kg)               Abdomen: Soft.  Non-tender.  No masses.  No HSM.  Incision/s: Intact.  Healing well.  No erythema.  No drainage.             Assessment:    G2P1011 Patient Active Problem List   Diagnosis Date Noted   Single liveborn infant, delivered  vaginally    Postpartum care following vaginal delivery    Encounter for care or examination of lactating mother    Normal labor and delivery 12/28/2019   Hemorrhoids during pregnancy 12/03/2019   Anxiety and depression 05/19/2019   Vaginal bleeding 02/26/2019   Supervision of low-risk first  pregnancy, unspecified trimester 09/25/2018   Marijuana use 09/25/2018     1. Postoperative state     Excellent recovery postop-no issues   Plan:            1.  May resume all normal activities.  2.  We will start OCPs.  3.  Follow-up in about 3 months for annual and Pap. Orders No orders of the defined types were placed in this encounter.   No orders of the defined types were placed in this encounter.     F/U  Return in about 3 months (around 03/30/2022) for Annual Physical.  Elonda Husky, M.D. 12/28/2021 3:45 PM

## 2021-12-28 NOTE — Progress Notes (Signed)
Patient presents for post-op follow-up due to a ruptured ectopic pregnancy. She states she is no longer bleeding or having any pain. Patient has not started OCP due to breastfeeding, has near plans to wean from nursing. No other concerns at this time.

## 2022-05-29 NOTE — L&D Delivery Note (Signed)
Delivery Note At  1703, a viable female was delivered  vaginally. Presenting ROA, with restitution to ROT.  APGAR:8, 9, ; weight pending .   Placenta status:  delivered at 1718,  .  Cord: 3 vessel  with the following complications: none .   Anesthesia:  Epidural Episiotomy:  none Lacerations:  none Suture Repair:  NA Est. Blood Loss (mL):  25  Mom to postpartum.  Baby to Couplet care / Skin to Skin.  Mirna Mires 03/01/2023, 6:33 PM

## 2022-06-26 ENCOUNTER — Telehealth: Payer: Self-pay

## 2022-06-26 NOTE — Telephone Encounter (Signed)
Pt calling; needs appt with [facility] as soon as possible; found out she was pregnant yesterday; had ectopic preg last year and had surgery.  Would like to be seen this week or Monday. [phone]

## 2022-06-27 ENCOUNTER — Ambulatory Visit (INDEPENDENT_AMBULATORY_CARE_PROVIDER_SITE_OTHER): Payer: Medicaid Other

## 2022-06-27 VITALS — BP 109/69 | HR 81 | Ht 66.0 in | Wt 166.0 lb

## 2022-06-27 DIAGNOSIS — Z8759 Personal history of other complications of pregnancy, childbirth and the puerperium: Secondary | ICD-10-CM

## 2022-06-27 DIAGNOSIS — Z3201 Encounter for pregnancy test, result positive: Secondary | ICD-10-CM | POA: Diagnosis not present

## 2022-06-27 DIAGNOSIS — Z3200 Encounter for pregnancy test, result unknown: Secondary | ICD-10-CM

## 2022-06-27 LAB — POCT URINE PREGNANCY: Preg Test, Ur: POSITIVE — AB

## 2022-06-27 NOTE — Telephone Encounter (Signed)
Patient was scheduled for nurse confirm for 1/30 and ultrasound is scheduled for 2/2 at [facility]. Patient aware.

## 2022-06-27 NOTE — Progress Notes (Signed)
    NURSE VISIT NOTE  Subjective:    Patient ID: Karen May, female    DOB: Dec 07, 1995, 27 y.o.   MRN: [id number]  HPI  Patient is a 27 y.o. G3P1011 female who presents for evaluation of amenorrhea. She believes she could be pregnant. Current symptoms also include: positive home pregnancy test. Last period was normal.Patient has sharp pain in side and wants to make sure that is not ectopic as she has had a ectopic last year.     Objective:    BP 109/69   Pulse 81   Ht 5\' 6"  (1.676 m)   Wt 166 lb (75.3 kg)   LMP 05/18/2022 (Approximate)   Breastfeeding Yes   BMI 26.79 kg/m   Lab Review  Results for orders placed or performed in visit on 06/27/22  POCT urine pregnancy  Result Value Ref Range   Preg Test, Ur Positive (A) Negative    Assessment:   1. Possible pregnancy, not confirmed     Plan:   Pregnancy Test: Positive      Landis Gandy, CMA

## 2022-06-28 ENCOUNTER — Telehealth: Payer: Self-pay | Admitting: Obstetrics and Gynecology

## 2022-06-28 NOTE — Telephone Encounter (Signed)
Left message for patient to call office to reschedule NOB intake for 07/13/2022.

## 2022-06-30 ENCOUNTER — Ambulatory Visit
Admission: RE | Admit: 2022-06-30 | Discharge: 2022-06-30 | Disposition: A | Discharge status: Home / Self Care | Payer: Medicaid Other | Source: Ambulatory Visit | Attending: Obstetrics and Gynecology | Admitting: Obstetrics and Gynecology

## 2022-06-30 DIAGNOSIS — Z8759 Personal history of other complications of pregnancy, childbirth and the puerperium: Secondary | ICD-10-CM | POA: Insufficient documentation

## 2022-06-30 DIAGNOSIS — Z3200 Encounter for pregnancy test, result unknown: Secondary | ICD-10-CM | POA: Insufficient documentation

## 2022-07-05 ENCOUNTER — Ambulatory Visit (INDEPENDENT_AMBULATORY_CARE_PROVIDER_SITE_OTHER): Payer: Medicaid Other | Admitting: Obstetrics and Gynecology

## 2022-07-05 ENCOUNTER — Encounter: Payer: Self-pay | Admitting: Obstetrics and Gynecology

## 2022-07-05 DIAGNOSIS — Z8759 Personal history of other complications of pregnancy, childbirth and the puerperium: Secondary | ICD-10-CM

## 2022-07-05 DIAGNOSIS — O3680X0 Pregnancy with inconclusive fetal viability, not applicable or unspecified: Secondary | ICD-10-CM | POA: Diagnosis not present

## 2022-07-05 DIAGNOSIS — Z3A01 Less than 8 weeks gestation of pregnancy: Secondary | ICD-10-CM

## 2022-07-05 DIAGNOSIS — Z3200 Encounter for pregnancy test, result unknown: Secondary | ICD-10-CM

## 2022-07-05 NOTE — Telephone Encounter (Signed)
Patient has rescheduled for 2/16 for NOB intake

## 2022-07-05 NOTE — Progress Notes (Signed)
Patient presents today to discuss recent ultrasound results. She states slight cramping on her right side but denies any bleeding. No additional concerns.

## 2022-07-05 NOTE — Progress Notes (Signed)
HPI:      [name] is a 27 y.o. G3P1011 who LMP was Patient's last menstrual period was 05/18/2022 (approximate).  Subjective:   She presents today because she has had a positive pregnancy test and ultrasound showing a sac in the uterus without fetal pole.  This was at 5 weeks and 5 days.  She is now 6 weeks and 5 days.  She reports no bleeding or other issues.  Of significant note she previously had an ectopic pregnancy. She has not yet begin prenatal vitamins.    Hx: The following portions of the patient's history were reviewed and updated as appropriate:             She  has a past medical history of Anemia, Anxiety, Eczema (2010), and Environmental allergies. She does not have any pertinent problems on file. She  has a past surgical history that includes Wisdom tooth extraction and Laparoscopic unilateral salpingectomy (Right, 11/14/2021). Her family history includes Breast cancer (age of onset: 67) in her cousin; Breast cancer (age of onset: 58) in her maternal aunt; Cancer in her cousin; Diabetes in her cousin and paternal grandmother; Lung cancer in her maternal grandmother; Schizophrenia in her maternal aunt. She  reports that she has never smoked. She has never used smokeless tobacco. She reports that she does not currently use alcohol. She reports that she does not currently use drugs. She currently has no medications in their medication list. She has No Known Allergies.       Review of Systems:  Review of Systems  Constitutional: Denied constitutional symptoms, night sweats, recent illness, fatigue, fever, insomnia and weight loss.  Eyes: Denied eye symptoms, eye pain, photophobia, vision change and visual disturbance.  Ears/Nose/Throat/Neck: Denied ear, nose, throat or neck symptoms, hearing loss, nasal discharge, sinus congestion and sore throat.  Cardiovascular: Denied cardiovascular symptoms, arrhythmia, chest pain/pressure, edema, exercise intolerance, orthopnea  and palpitations.  Respiratory: Denied pulmonary symptoms, asthma, pleuritic pain, productive sputum, cough, dyspnea and wheezing.  Gastrointestinal: Denied, gastro-esophageal reflux, melena, nausea and vomiting.  Genitourinary: Denied genitourinary symptoms including symptomatic vaginal discharge, pelvic relaxation issues, and urinary complaints.  Musculoskeletal: Denied musculoskeletal symptoms, stiffness, swelling, muscle weakness and myalgia.  Dermatologic: Denied dermatology symptoms, rash and scar.  Neurologic: Denied neurology symptoms, dizziness, headache, neck pain and syncope.  Psychiatric: Denied psychiatric symptoms, anxiety and depression.  Endocrine: Denied endocrine symptoms including hot flashes and night sweats.   Meds:   No current outpatient medications on file prior to visit.   No current facility-administered medications on file prior to visit.      Objective:     There were no vitals filed for this visit. There were no vitals filed for this visit.                      Assessment:    G3P1011 Patient Active Problem List   Diagnosis Date Noted   Single liveborn infant, delivered vaginally    Postpartum care following vaginal delivery    Encounter for care or examination of lactating mother    Normal labor and delivery 12/28/2019   Hemorrhoids during pregnancy 12/03/2019   Anxiety and depression 05/19/2019   Vaginal bleeding 02/26/2019   Supervision of low-risk first pregnancy, unspecified trimester 09/25/2018   Marijuana use 09/25/2018     1. Possible pregnancy, not confirmed   2. History of ectopic pregnancy     Patient likely has intrauterine pregnancy that was too early to identify  crown-rump length. It is also possible she has a blighted ovum Based on the fact there is a normal gestational sac in the uterus with yolk sac is very unlikely she has an ectopic as well.  In addition she is having no bleeding abdominal pain or cramping.   Plan:             1.  Ultrasound next week to diagnose fetal pole with crown-rump length and heartbeat. Orders Orders Placed This Encounter  Procedures   US OB Comp Less 14 Wks    No orders of the defined types were placed in this encounter.     F/U  No follow-ups on file. I spent 20 minutes involved in the care of this patient preparing to see the patient by obtaining and reviewing her medical history (including labs, imaging tests and prior procedures), documenting clinical information in the electronic health record (EHR), counseling and coordinating care plans, writing and sending prescriptions, ordering tests or procedures and in direct communicating with the patient and medical staff discussing pertinent items from her history and physical exam.  Finis Bud, M.D. 07/05/2022 3:12 PM

## 2022-07-10 ENCOUNTER — Encounter: Payer: Self-pay | Admitting: Obstetrics and Gynecology

## 2022-07-13 ENCOUNTER — Ambulatory Visit
Admission: RE | Admit: 2022-07-13 | Discharge: 2022-07-13 | Disposition: A | Discharge status: Home / Self Care | Payer: Medicaid Other | Source: Ambulatory Visit | Attending: Obstetrics and Gynecology | Admitting: Obstetrics and Gynecology

## 2022-07-13 ENCOUNTER — Other Ambulatory Visit: Payer: Self-pay | Admitting: Obstetrics and Gynecology

## 2022-07-13 ENCOUNTER — Ambulatory Visit: Payer: Medicaid Other

## 2022-07-13 DIAGNOSIS — Z8759 Personal history of other complications of pregnancy, childbirth and the puerperium: Secondary | ICD-10-CM | POA: Insufficient documentation

## 2022-07-13 DIAGNOSIS — Z3200 Encounter for pregnancy test, result unknown: Secondary | ICD-10-CM | POA: Insufficient documentation

## 2022-07-14 ENCOUNTER — Ambulatory Visit: Payer: Medicaid Other

## 2022-07-14 DIAGNOSIS — Z3480 Encounter for supervision of other normal pregnancy, unspecified trimester: Secondary | ICD-10-CM | POA: Insufficient documentation

## 2022-07-14 NOTE — Progress Notes (Signed)
During NOB Intake we were disconnected; left two msgs for pt to call back and if not able to call back before 4pm would need to reschedule. (Need to pick back up at [facility])

## 2022-07-16 IMAGING — US US OB < 14 WEEKS - US OB TV
1 series · 13 of 28 positions shown · non-contrast
Comparison: None Available.

CLINICAL DATA: Abdominal pain for 1 day.

EXAM:
OBSTETRIC <14 WK US AND TRANSVAGINAL OB US
TECHNIQUE: Both transabdominal and transvaginal ultrasound examinations were
performed for complete evaluation of the gestation as well as the
maternal uterus, adnexal regions, and pelvic cul-de-sac.
Transvaginal technique was performed to assess early pregnancy.

[Series 1: us ob less than 14 weeks with ob transvaginal · 134 acquisitions, 13 frames shown]
[im 5/134]
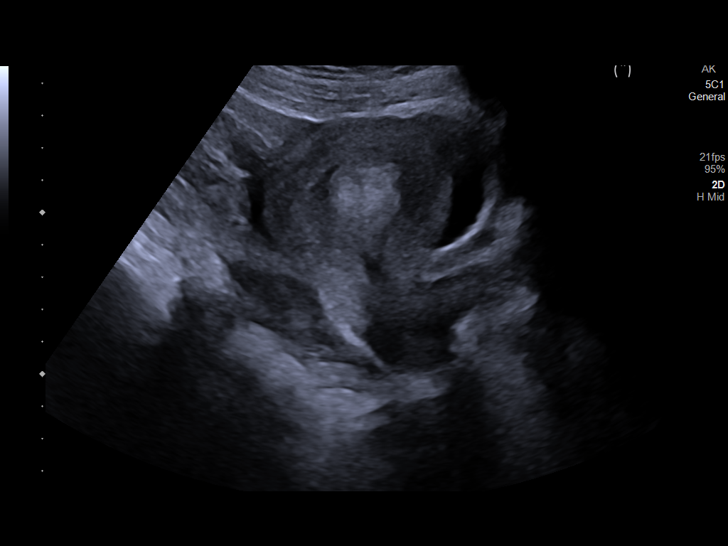
[im 15/134]
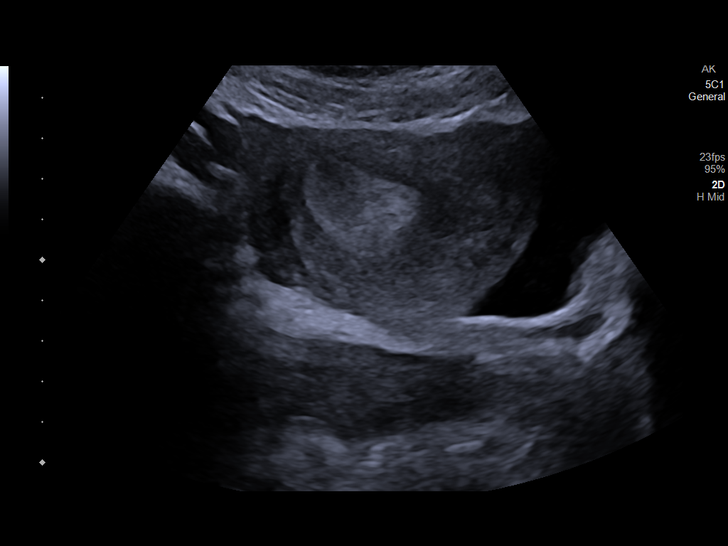
[im 25/134]
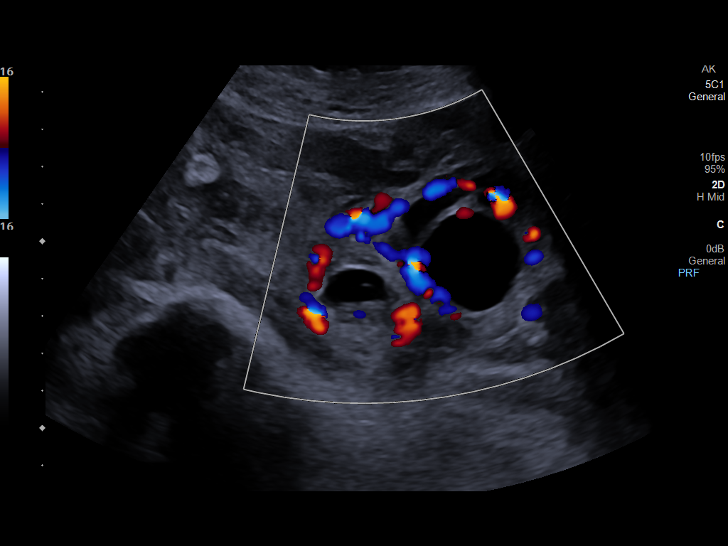
[im 35/134]
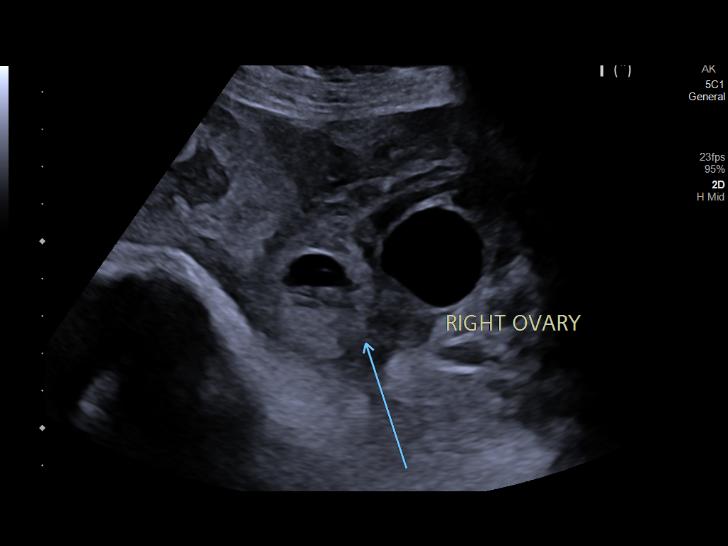
[im 45/134]
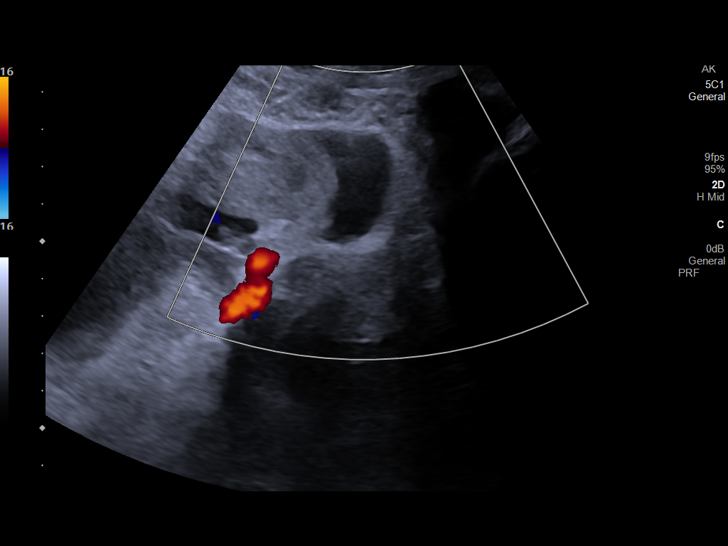
[im 55/134]
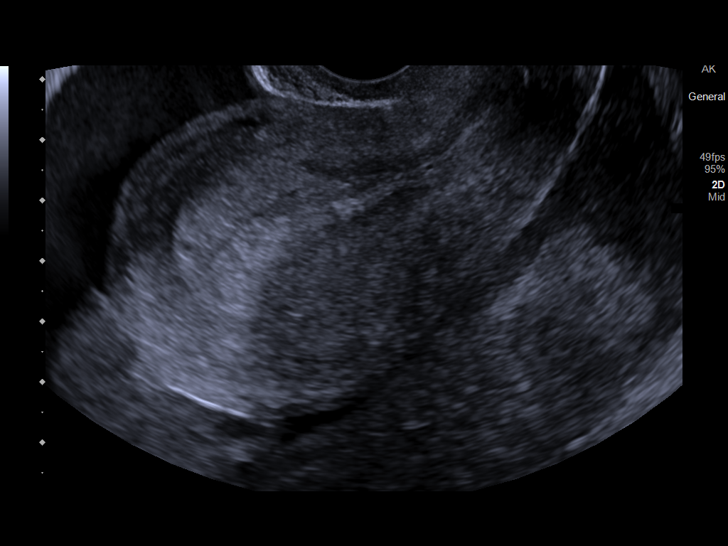
[im 69/134]
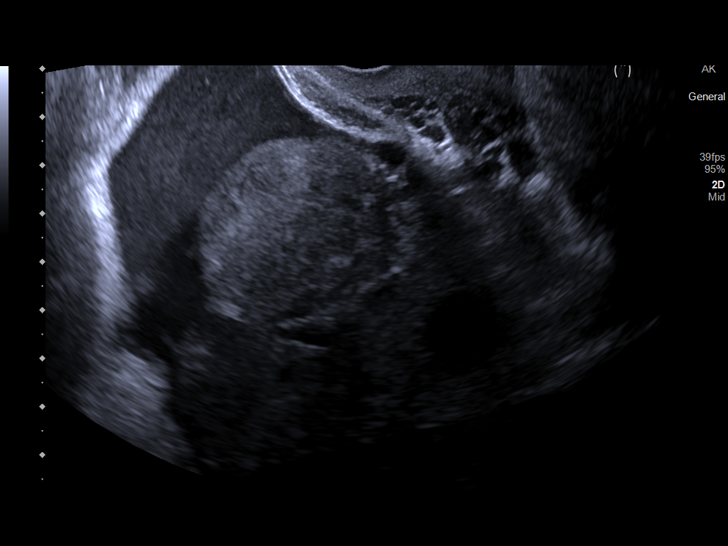
[im 79/134]
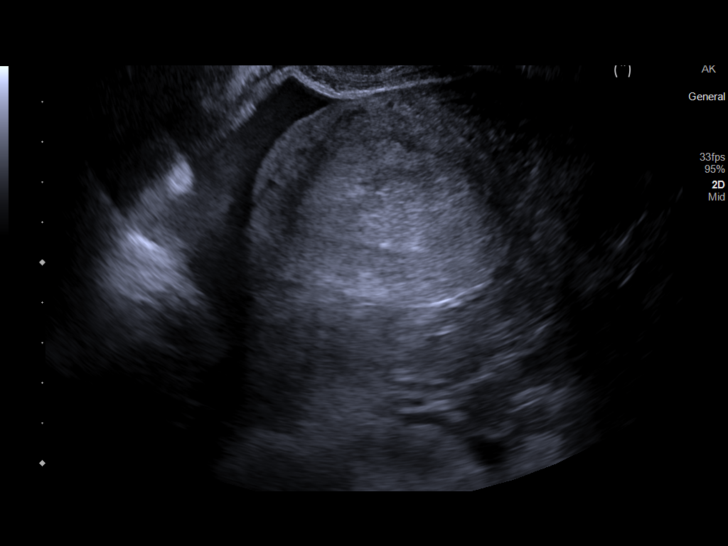
[im 89/134]
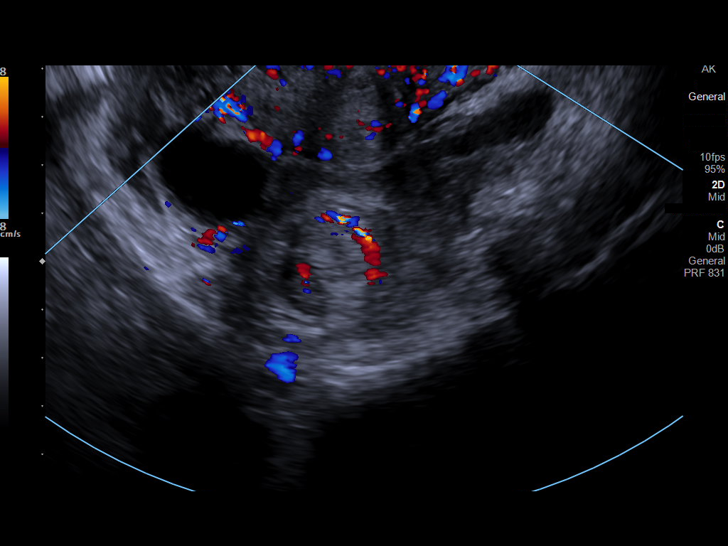
[im 99/134]
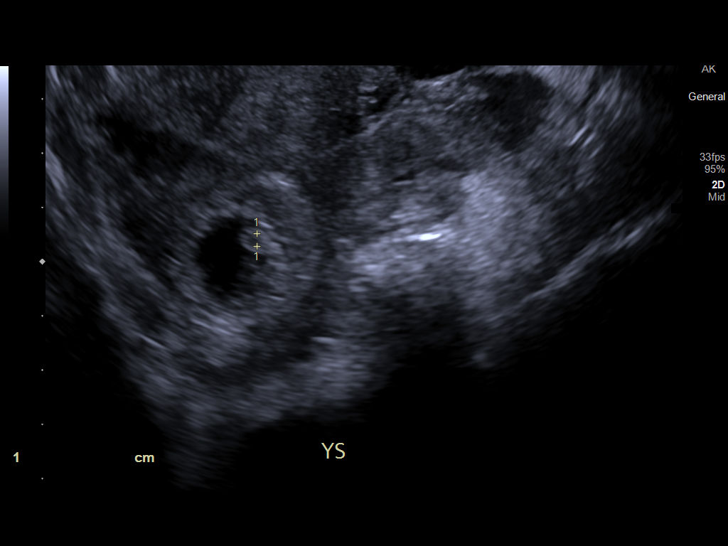
[im 109/134]
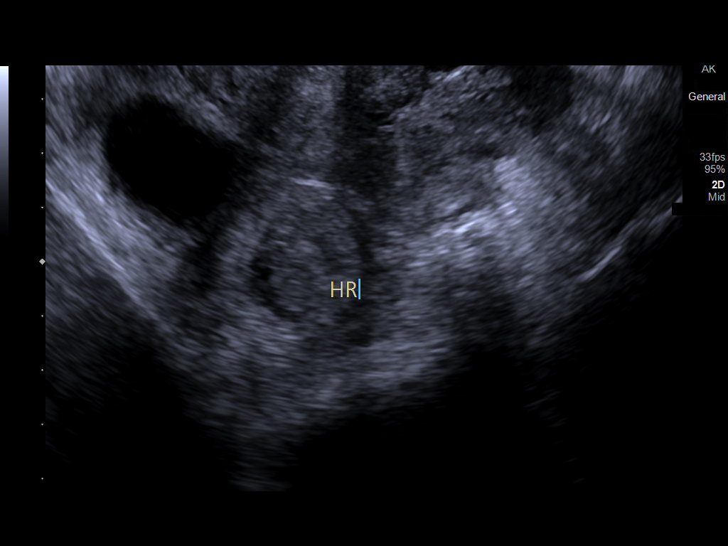
[im 119/134]
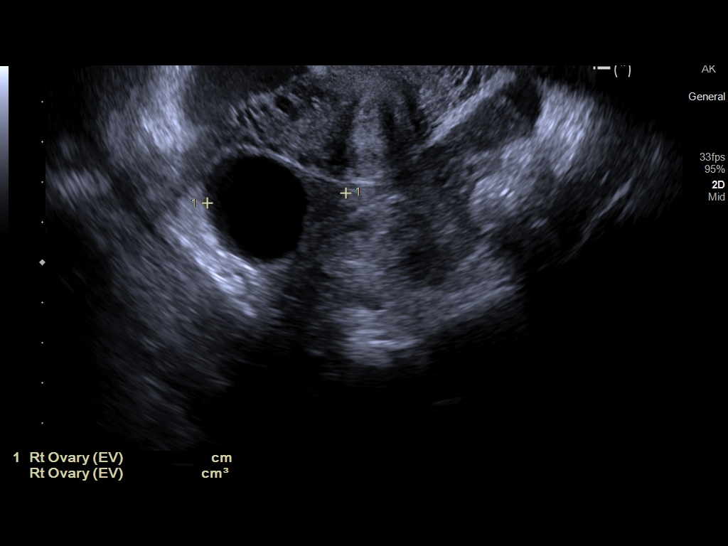
[im 129/134]
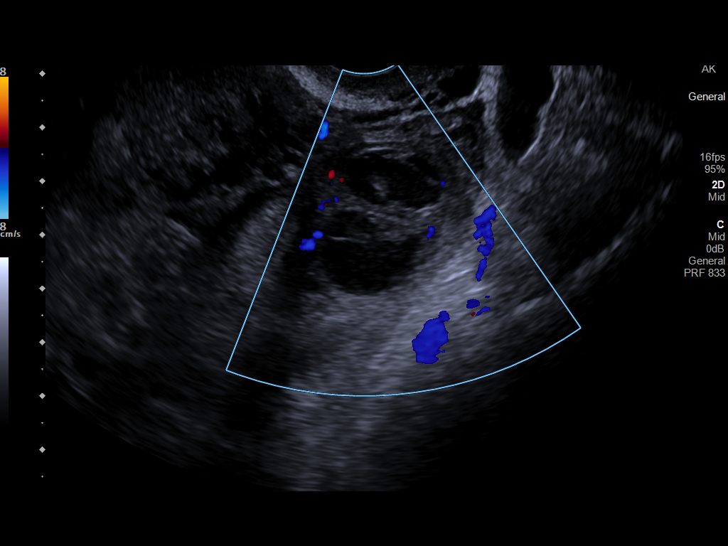

[13 of 28 positions shown; findings below may reference images not displayed]

FINDINGS: Intrauterine gestational sac: None

Yolk sac:  Not Visualized.

Embryo:  Not Visualized.

Cardiac Activity: Not Visualized.

Subchorionic hemorrhage:  None visualized.

Maternal uterus/adnexae:

Subchorionic hemorrhage: None

Right ovary: Normal

Left ovary: Normal

Other :Within the right adnexa there is a cystic mass adjacent to
the right ovary which contains a gestational sac an fetal pole as
well as a yolk sac. The fetal pole has a crown rump length 1.09 cm
corresponding to a 7 week 1 day gestation. Fetal heart rate is equal
to 150 BPM.

Decidual reaction within the uterus noted with endometrium measuring
1.7 cm in thickness.

Free fluid: Small to moderate volume of mildly complex fluid
identified within the pelvis.
IMPRESSION: 1. No intrauterine gestational sac identified.
2. Ectopic pregnancy identified within the right adnexa containing
fetal pole and yolk sac. Estimated gestational age of the ectopic
pregnancy is 7 weeks and 1 day. There is a small to moderate volume
of mildly complex fluid noted within the pelvis.
3. Critical Value/emergent results were called by telephone at the
time of interpretation on 11/14/2021 at [DATE] to provider ZOSIMAS
MAYBEE , who verbally acknowledged these results.

## 2022-07-19 NOTE — Progress Notes (Signed)
She needs a repeat ultrasound if she has not had it already

## 2022-07-25 ENCOUNTER — Ambulatory Visit (INDEPENDENT_AMBULATORY_CARE_PROVIDER_SITE_OTHER): Payer: Medicaid Other

## 2022-07-25 DIAGNOSIS — Z3689 Encounter for other specified antenatal screening: Secondary | ICD-10-CM

## 2022-07-25 DIAGNOSIS — Z369 Encounter for antenatal screening, unspecified: Secondary | ICD-10-CM

## 2022-07-25 DIAGNOSIS — Z3480 Encounter for supervision of other normal pregnancy, unspecified trimester: Secondary | ICD-10-CM

## 2022-07-25 DIAGNOSIS — Z130 Encounter for screening for diseases of the blood and blood-forming organs and certain disorders involving the immune mechanism: Secondary | ICD-10-CM

## 2022-07-25 NOTE — Progress Notes (Signed)
New OB Intake  I connected with  Karen May on 07/25/22 at 11:15 AM EST by telephone and verified that I am speaking with the correct person using two identifiers. Nurse is located at [employer] and pt is located at home.  I explained I am completing New OB Intake today. We discussed her EDD of 02/22/2023 that is based on LMP of 05/18/2022. Pt is G4/P1021. I reviewed her allergies, medications, Medical/Surgical/OB history, and appropriate screenings. There are cats in the home  no.  Based on history, this is a/an pregnancy uncomplicated .   Patient Active Problem List   Diagnosis Date Noted   Supervision of other normal pregnancy, antepartum 07/14/2022   Single liveborn infant, delivered vaginally    Postpartum care following vaginal delivery    Encounter for care or examination of lactating mother    Normal labor and delivery 12/28/2019   Hemorrhoids during pregnancy 12/03/2019   Anxiety and depression 05/19/2019   Vaginal bleeding 02/26/2019   Supervision of low-risk first pregnancy, unspecified trimester 09/25/2018   Marijuana use 09/25/2018    Concerns addressed today None  Delivery Plans:  Plans to deliver at [facility].  Anatomy US Explained first scheduled [place] will be  an anatomy scan at 20 weeks.  Labs Discussed genetic screening with patient. Patient declines genetic testing . Discussed possible labs to be drawn at new OB appointment.  COVID Vaccine Patient has not had COVID vaccine.   Social Determinants of Health Food Insecurity: denies food insecurity Transportation: Patient denies transportation needs. Childcare: Discussed no children allowed at ultrasound appointments.   First visit review I reviewed new OB appt with pt. I explained she will have ob bloodwork and pap smear/pelvic exam if indicated. Explained pt will be seen by Dr. Jeannie Fend at first visit; encounter routed to appropriate provider.   Cleophas Dunker, [facility] 07/25/2022   11:57 AM

## 2022-07-25 NOTE — Patient Instructions (Signed)
First Trimester of Pregnancy  The first trimester of pregnancy starts on the first day of your last menstrual period until the end of week 12. This is also called months 1 through 3 of pregnancy. Body changes during your first trimester Your body goes through many changes during pregnancy. The changes usually return to normal after your baby is born. Physical changes You may gain or lose weight. Your breasts may grow larger and hurt. The area around your nipples may get darker. Dark spots or blotches may develop on your face. You may have changes in your hair. Health changes You may feel like you might vomit (nauseous), and you may vomit. You may have heartburn. You may have headaches. You may have trouble pooping (constipation). Your gums may bleed. Other changes You may get tired easily. You may pee (urinate) more often. Your menstrual periods will stop. You may not feel hungry. You may want to eat certain kinds of food. You may have changes in your emotions from day to day. You may have more dreams. Follow these instructions at home: Medicines Take over-the-counter and prescription medicines only as told by your doctor. Some medicines are not safe during pregnancy. Take a prenatal vitamin that contains at least 600 micrograms (mcg) of folic acid. Eating and drinking Eat healthy meals that include: Fresh fruits and vegetables. Whole grains. Good sources of protein, such as meat, eggs, or tofu. Low-fat dairy products. Avoid raw meat and unpasteurized juice, milk, and cheese. If you feel like you may vomit, or you vomit: Eat 4 or 5 small meals a day instead of 3 large meals. Try eating a few soda crackers. Drink liquids between meals instead of during meals. You may need to take these actions to prevent or treat trouble pooping: Drink enough fluids to keep your pee (urine) pale yellow. Eat foods that are high in fiber. These include beans, whole grains, and fresh fruits and  vegetables. Limit foods that are high in fat and sugar. These include fried or sweet foods. Activity Exercise only as told by your doctor. Most people can do their usual exercise routine during pregnancy. Stop exercising if you have cramps or pain in your lower belly (abdomen) or low back. Do not exercise if it is too hot or too humid, or if you are in a place of great height (high altitude). Avoid heavy lifting. If you choose to, you may have sex unless your doctor tells you not to. Relieving pain and discomfort Wear a good support bra if your breasts are sore. Rest with your legs raised (elevated) if you have leg cramps or low back pain. If you have bulging veins (varicose veins) in your legs: Wear support hose as told by your doctor. Raise your feet for 15 minutes, 3-4 times a day. Limit salt in your food. Safety Wear your seat belt at all times when you are in a car. Talk with your doctor if someone is hurting you or yelling at you. Talk with your doctor if you are feeling sad or have thoughts of hurting yourself. Lifestyle Do not use hot tubs, steam rooms, or saunas. Do not douche. Do not use tampons or scented sanitary pads. Do not use herbal medicines, illegal drugs, or medicines that are not approved by your doctor. Do not drink alcohol. Do not smoke or use any products that contain nicotine or tobacco. If you need help quitting, ask your doctor. Avoid cat litter boxes and soil that is used by cats. These carry   germs that can cause harm to the baby and can cause a loss of your baby by miscarriage or stillbirth. General instructions Keep all follow-up visits. This is important. Ask for help if you need counseling or if you need help with nutrition. Your doctor can give you advice or tell you where to go for help. Visit your dentist. At home, brush your teeth with a soft toothbrush. Floss gently. Write down your questions. Take them to your prenatal visits. Where to find more  information American Pregnancy Association: americanpregnancy.org American College of Obstetricians and Gynecologists: www.acog.org Office on Women's Health: womenshealth.gov/pregnancy Contact a doctor if: You are dizzy. You have a fever. You have mild cramps or pressure in your lower belly. You have a nagging pain in your belly area. You continue to feel like you may vomit, you vomit, or you have watery poop (diarrhea) for 24 hours or longer. You have a bad-smelling fluid coming from your vagina. You have pain when you pee. You are exposed to a disease that spreads from person to person, such as chickenpox, measles, Zika virus, HIV, or hepatitis. Get help right away if: You have spotting or bleeding from your vagina. You have very bad belly cramping or pain. You have shortness of breath or chest pain. You have any kind of injury, such as from a fall or a car crash. You have new or increased pain, swelling, or redness in an arm or leg. Summary The first trimester of pregnancy starts on the first day of your last menstrual period until the end of week 12 (months 1 through 3). Eat 4 or 5 small meals a day instead of 3 large meals. Do not smoke or use any products that contain nicotine or tobacco. If you need help quitting, ask your doctor. Keep all follow-up visits. This information is not intended to replace advice given to you by your health care provider. Make sure you discuss any questions you have with your health care provider. Document Revised: 10/22/2019 Document Reviewed: 08/28/2019 Elsevier Patient Education  2023 Elsevier Inc. Commonly Asked Questions During Pregnancy  Cats: A parasite can be excreted in cat feces.  To avoid exposure you need to have another person empty the little box.  If you must empty the litter box you will need to wear gloves.  Wash your hands after handling your cat.  This parasite can also be found in raw or undercooked meat so this should also be  avoided.  Colds, Sore Throats, Flu: Please check your medication sheet to see what you can take for symptoms.  If your symptoms are unrelieved by these medications please call the office.  Dental Work: Most any dental work your dentist recommends is permitted.  X-rays should only be taken during the first trimester if absolutely necessary.  Your abdomen should be shielded with a lead apron during all x-rays.  Please notify your provider prior to receiving any x-rays.  Novocaine is fine; gas is not recommended.  If your dentist requires a note from us prior to dental work please call the office and we will provide one for you.  Exercise: Exercise is an important part of staying healthy during your pregnancy.  You may continue most exercises you were accustomed to prior to pregnancy.  Later in your pregnancy you will most likely notice you have difficulty with activities requiring balance like riding a bicycle.  It is important that you listen to your body and avoid activities that put you at a higher   risk of falling.  Adequate rest and staying well hydrated are a must!  If you have questions about the safety of specific activities ask your provider.    Exposure to Children with illness: Try to avoid obvious exposure; report any symptoms to us when noted,  If you have chicken pos, red measles or mumps, you should be immune to these diseases.   Please do not take any vaccines while pregnant unless you have checked with your OB provider.  Fetal Movement: After 28 weeks we recommend you do "kick counts" twice daily.  Lie or sit down in a calm quiet environment and count your baby movements "kicks".  You should feel your baby at least 10 times per hour.  If you have not felt 10 kicks within the first hour get up, walk around and have something sweet to eat or drink then repeat for an additional hour.  If count remains less than 10 per hour notify your provider.  Fumigating: Follow your pest control agent's  advice as to how long to stay out of your home.  Ventilate the area well before re-entering.  Hemorrhoids:   Most over-the-counter preparations can be used during pregnancy.  Check your medication to see what is safe to use.  It is important to use a stool softener or fiber in your diet and to drink lots of liquids.  If hemorrhoids seem to be getting worse please call the office.   Hot Tubs:  Hot tubs Jacuzzis and saunas are not recommended while pregnant.  These increase your internal body temperature and should be avoided.  Intercourse:  Sexual intercourse is safe during pregnancy as long as you are comfortable, unless otherwise advised by your provider.  Spotting may occur after intercourse; report any bright red bleeding that is heavier than spotting.  Labor:  If you know that you are in labor, please go to the hospital.  If you are unsure, please call the office and let us help you decide what to do.  Lifting, straining, etc:  If your job requires heavy lifting or straining please check with your provider for any limitations.  Generally, you should not lift items heavier than that you can lift simply with your hands and arms (no back muscles)  Painting:  Paint fumes do not harm your pregnancy, but may make you ill and should be avoided if possible.  Latex or water based paints have less odor than oils.  Use adequate ventilation while painting.  Permanents & Hair Color:  Chemicals in hair dyes are not recommended as they cause increase hair dryness which can increase hair loss during pregnancy.  " Highlighting" and permanents are allowed.  Dye may be absorbed differently and permanents may not hold as well during pregnancy.  Sunbathing:  Use a sunscreen, as skin burns easily during pregnancy.  Drink plenty of fluids; avoid over heating.  Tanning Beds:  Because their possible side effects are still unknown, tanning beds are not recommended.  Ultrasound Scans:  Routine ultrasounds are performed  at approximately 20 weeks.  You will be able to see your baby's general anatomy an if you would like to know the gender this can usually be determined as well.  If it is questionable when you conceived you may also receive an ultrasound early in your pregnancy for dating purposes.  Otherwise ultrasound exams are not routinely performed unless there is a medical necessity.  Although you can request a scan we ask that you pay for it when   conducted because insurance does not cover " patient request" scans.  Work: If your pregnancy proceeds without complications you may work until your due date, unless your physician or employer advises otherwise.  Round Ligament Pain/Pelvic Discomfort:  Sharp, shooting pains not associated with bleeding are fairly common, usually occurring in the second trimester of pregnancy.  They tend to be worse when standing up or when you remain standing for long periods of time.  These are the result of pressure of certain pelvic ligaments called "round ligaments".  Rest, Tylenol and heat seem to be the most effective relief.  As the womb and fetus grow, they rise out of the pelvis and the discomfort improves.  Please notify the office if your pain seems different than that described.  It may represent a more serious condition.  Common Medications Safe in Pregnancy  Acne:      Constipation:  Benzoyl Peroxide     Colace  Clindamycin      Dulcolax Suppository  Topica Erythromycin     Fibercon  Salicylic Acid      Metamucil         Miralax AVOID:        Senakot   Accutane    Cough:  Retin-A       Cough Drops  Tetracycline      Phenergan w/ Codeine if Rx  Minocycline      Robitussin (Plain & DM)  Antibiotics:     Crabs/Lice:  Ceclor       RID  Cephalosporins    AVOID:  E-Mycins      Kwell  Keflex  Macrobid/Macrodantin   Diarrhea:  Penicillin      Kao-Pectate  Zithromax      Imodium AD         PUSH FLUIDS AVOID:       Cipro     Fever:  Tetracycline      Tylenol (Regular  or Extra  Minocycline       Strength)  Levaquin      Extra Strength-Do not          Exceed 8 tabs/24 hrs Caffeine:        <200mg/day (equiv. To 1 cup of coffee or  approx. 3 12 oz sodas)         Gas: Cold/Hayfever:       Gas-X  Benadryl      Mylicon  Claritin       Phazyme  **Claritin-D        Chlor-Trimeton    Headaches:  Dimetapp      ASA-Free Excedrin  Drixoral-Non-Drowsy     Cold Compress  Mucinex (Guaifenasin)     Tylenol (Regular or Extra  Sudafed/Sudafed-12 Hour     Strength)  **Sudafed PE Pseudoephedrine   Tylenol Cold & Sinus     Vicks Vapor Rub  Zyrtec  **AVOID if Problems With Blood Pressure         Heartburn: Avoid lying down for at least 1 hour after meals  Aciphex      Maalox     Rash:  Milk of Magnesia     Benadryl    Mylanta       1% Hydrocortisone Cream  Pepcid  Pepcid Complete   Sleep Aids:  Prevacid      Ambien   Prilosec       Benadryl  Rolaids       Chamomile Tea  Tums (Limit 4/day)     Unisom           Tylenol PM         Warm milk-add vanilla or  Hemorrhoids:       Sugar for taste  Anusol/Anusol H.C.  (RX: Analapram 2.5%)  Sugar Substitutes:  Hydrocortisone OTC     Ok in moderation  Preparation H      Tucks        Vaseline lotion applied to tissue with wiping    Herpes:     Throat:  Acyclovir      Oragel  Famvir  Valtrex     Vaccines:         Flu Shot Leg Cramps:       *Gardasil  Benadryl      Hepatitis A         Hepatitis B Nasal Spray:       Pneumovax  Saline Nasal Spray     Polio Booster         Tetanus Nausea:       Tuberculosis test or PPD  Vitamin B6 25 mg TID   AVOID:    Dramamine      *Gardasil  Emetrol       Live Poliovirus  Ginger Root 250 mg QID    MMR (measles, mumps &  High Complex Carbs @ Bedtime    rebella)  Sea Bands-Accupressure    Varicella (Chickenpox)  Unisom 1/2 tab TID     *No known complications           If received before Pain:         Known pregnancy;   Darvocet       Resume series  after  Lortab        Delivery  Percocet    Yeast:   Tramadol      Femstat  Tylenol 3      Gyne-lotrimin  Ultram       Monistat  Vicodin           MISC:         All Sunscreens           Hair Coloring/highlights          Insect Repellant's          (Including DEET)         Mystic Tans  

## 2022-07-28 ENCOUNTER — Telehealth: Payer: Self-pay

## 2022-07-28 NOTE — Telephone Encounter (Signed)
Called Karen May left voicemail letting her know that her FMLA is completed but one form needs to be signed by her.

## 2022-08-03 ENCOUNTER — Other Ambulatory Visit (HOSPITAL_COMMUNITY)
Admission: RE | Admit: 2022-08-03 | Discharge: 2022-08-03 | Disposition: A | Discharge status: Home / Self Care | Payer: Medicaid Other | Source: Ambulatory Visit | Attending: Obstetrics and Gynecology | Admitting: Obstetrics and Gynecology

## 2022-08-03 ENCOUNTER — Other Ambulatory Visit: Payer: Medicaid Other

## 2022-08-03 DIAGNOSIS — Z3480 Encounter for supervision of other normal pregnancy, unspecified trimester: Secondary | ICD-10-CM | POA: Insufficient documentation

## 2022-08-03 DIAGNOSIS — Z369 Encounter for antenatal screening, unspecified: Secondary | ICD-10-CM | POA: Insufficient documentation

## 2022-08-03 DIAGNOSIS — Z130 Encounter for screening for diseases of the blood and blood-forming organs and certain disorders involving the immune mechanism: Secondary | ICD-10-CM

## 2022-08-04 LAB — MICROSCOPIC EXAMINATION
Bacteria, UA: NONE SEEN
Casts: NONE SEEN /lpf
RBC, Urine: NONE SEEN /hpf (ref 0–2)

## 2022-08-04 LAB — URINALYSIS, ROUTINE W REFLEX MICROSCOPIC
Bilirubin, UA: NEGATIVE
Glucose, UA: NEGATIVE
Nitrite, UA: NEGATIVE
Protein,UA: NEGATIVE
RBC, UA: NEGATIVE
Specific Gravity, UA: 1.026 (ref 1.005–1.030)
Urobilinogen, Ur: 1 mg/dL (ref 0.2–1.0)
pH, UA: 6 (ref 5.0–7.5)

## 2022-08-05 LAB — MONITOR DRUG PROFILE 14(MW)
Amphetamine Scrn, Ur: NEGATIVE ng/mL
BARBITURATE SCREEN URINE: NEGATIVE ng/mL
BENZODIAZEPINE SCREEN, URINE: NEGATIVE ng/mL
Buprenorphine, Urine: NEGATIVE ng/mL
CANNABINOIDS UR QL SCN: NEGATIVE ng/mL
Cocaine (Metab) Scrn, Ur: NEGATIVE ng/mL
Creatinine(Crt), U: 208.1 mg/dL (ref 20.0–300.0)
Fentanyl, Urine: NEGATIVE pg/mL
Meperidine Screen, Urine: NEGATIVE ng/mL
Methadone Screen, Urine: NEGATIVE ng/mL
OXYCODONE+OXYMORPHONE UR QL SCN: NEGATIVE ng/mL
Opiate Scrn, Ur: NEGATIVE ng/mL
Ph of Urine: 5.6 (ref 4.5–8.9)
Phencyclidine Qn, Ur: NEGATIVE ng/mL
Propoxyphene Scrn, Ur: NEGATIVE ng/mL
SPECIFIC GRAVITY: 1.024
Tramadol Screen, Urine: NEGATIVE ng/mL

## 2022-08-05 LAB — NICOTINE SCREEN, URINE: Cotinine Ql Scrn, Ur: NEGATIVE ng/mL

## 2022-08-05 LAB — URINE CULTURE, OB REFLEX

## 2022-08-05 LAB — CULTURE, OB URINE

## 2022-08-07 LAB — CBC/D/PLT+RPR+RH+ABO+RUBIGG...
Antibody Screen: NEGATIVE
Basophils Absolute: 0 10*3/uL (ref 0.0–0.2)
Basos: 0 %
EOS (ABSOLUTE): 0.1 10*3/uL (ref 0.0–0.4)
Eos: 1 %
HCV Ab: NONREACTIVE
HIV Screen 4th Generation wRfx: NONREACTIVE
Hematocrit: 38.3 % (ref 34.0–46.6)
Hemoglobin: 12.4 g/dL (ref 11.1–15.9)
Hepatitis B Surface Ag: NEGATIVE
Immature Grans (Abs): 0 10*3/uL (ref 0.0–0.1)
Immature Granulocytes: 0 %
Lymphocytes Absolute: 1.7 10*3/uL (ref 0.7–3.1)
Lymphs: 25 %
MCH: 27.4 pg (ref 26.6–33.0)
MCHC: 32.4 g/dL (ref 31.5–35.7)
MCV: 85 fL (ref 79–97)
Monocytes Absolute: 0.6 10*3/uL (ref 0.1–0.9)
Monocytes: 9 %
Neutrophils Absolute: 4.5 10*3/uL (ref 1.4–7.0)
Neutrophils: 65 %
Platelets: 283 10*3/uL (ref 150–450)
RBC: 4.53 x10E6/uL (ref 3.77–5.28)
RDW: 15.2 % (ref 11.7–15.4)
RPR Ser Ql: NONREACTIVE
Rh Factor: POSITIVE
Rubella Antibodies, IGG: 2.3 index (ref >0.99)
Varicella zoster IgG: 3432 index (ref >165)
WBC: 6.9 10*3/uL (ref 3.4–10.8)

## 2022-08-07 LAB — URINE CYTOLOGY ANCILLARY ONLY
Chlamydia: POSITIVE — AB
Comment: NEGATIVE
Comment: NORMAL
Neisseria Gonorrhea: NEGATIVE

## 2022-08-07 LAB — HGB FRACTIONATION CASCADE
Hgb A2: 2.5 % (ref 1.8–3.2)
Hgb A: 97.5 % (ref 96.4–98.8)
Hgb F: 0 % (ref 0.0–2.0)
Hgb S: 0 %

## 2022-08-07 LAB — HCV INTERPRETATION

## 2022-08-08 ENCOUNTER — Other Ambulatory Visit: Payer: Self-pay

## 2022-08-08 DIAGNOSIS — A749 Chlamydial infection, unspecified: Secondary | ICD-10-CM

## 2022-08-08 MED ORDER — AZITHROMYCIN 500 MG PO TABS: 1000.0000 mg | ORAL_TABLET | Freq: Once | ORAL | 1 refills | Status: AC

## 2022-08-08 NOTE — Progress Notes (Signed)
LVM for patient regarding results and treatment. MyChart message and prescription sent.

## 2022-08-09 ENCOUNTER — Ambulatory Visit: Payer: Medicaid Other

## 2022-08-09 ENCOUNTER — Encounter: Payer: Medicaid Other | Admitting: Obstetrics and Gynecology

## 2022-08-17 ENCOUNTER — Encounter: Payer: Self-pay | Admitting: Obstetrics and Gynecology

## 2022-08-17 ENCOUNTER — Ambulatory Visit (INDEPENDENT_AMBULATORY_CARE_PROVIDER_SITE_OTHER): Payer: Medicaid Other | Admitting: Obstetrics and Gynecology

## 2022-08-17 VITALS — BP 96/64 | HR 78 | Wt 160.6 lb

## 2022-08-17 DIAGNOSIS — Z3481 Encounter for supervision of other normal pregnancy, first trimester: Secondary | ICD-10-CM | POA: Diagnosis not present

## 2022-08-17 DIAGNOSIS — Z3A11 11 weeks gestation of pregnancy: Secondary | ICD-10-CM

## 2022-08-17 DIAGNOSIS — Z1379 Encounter for other screening for genetic and chromosomal anomalies: Secondary | ICD-10-CM

## 2022-08-17 DIAGNOSIS — Z3480 Encounter for supervision of other normal pregnancy, unspecified trimester: Secondary | ICD-10-CM

## 2022-08-17 LAB — POCT URINALYSIS DIPSTICK OB
Bilirubin, UA: NEGATIVE
Blood, UA: NEGATIVE
Glucose, UA: NEGATIVE
Ketones, UA: NEGATIVE
Leukocytes, UA: NEGATIVE
Nitrite, UA: NEGATIVE
Spec Grav, UA: 1.01 (ref 1.010–1.025)
Urobilinogen, UA: 0.2 E.U./dL
pH, UA: 7 (ref 5.0–8.0)

## 2022-08-17 NOTE — Progress Notes (Signed)
NOB: Having some trouble with nausea and vomiting.  Literature given.  MaterniT 21 today.  aFP next visit.    Physical examination General NAD, Conversant  HEENT Atraumatic; Op clear with mmm.  Normo-cephalic. Pupils reactive. Anicteric sclerae  Thyroid/Neck Smooth without nodularity or enlargement. Normal ROM.  Neck Supple.  Skin No rashes, lesions or ulceration. Normal palpated skin turgor. No nodularity.  Breasts: No masses or discharge.  Symmetric.  No axillary adenopathy.  Lungs: Clear to auscultation.No rales or wheezes. Normal Respiratory effort, no retractions.  Heart: NSR.  No murmurs or rubs appreciated. No peripheral edema  Abdomen: Soft.  Non-tender.  No masses.  No HSM. No hernia  Extremities: Moves all appropriately.  Normal ROM for age. No lymphadenopathy.  Neuro: Oriented to PPT.  Normal mood. Normal affect.     Pelvic: Deferred    FHTS - 157

## 2022-08-17 NOTE — Progress Notes (Signed)
Patient presents today for New OB physical. She states trouble with nausea and vomiting. She is up to date for her pap smear. Patient wants genetic testing, Materniti21 ordered.  She states no other questions or concerns at this time.

## 2022-08-22 LAB — MATERNIT 21 PLUS CORE, BLOOD
Fetal Fraction: 7
Result (T21): NEGATIVE
Trisomy 13 (Patau syndrome): NEGATIVE
Trisomy 18 (Edwards syndrome): NEGATIVE
Trisomy 21 (Down syndrome): NEGATIVE

## 2022-09-14 ENCOUNTER — Ambulatory Visit (INDEPENDENT_AMBULATORY_CARE_PROVIDER_SITE_OTHER): Payer: Medicaid Other | Admitting: Obstetrics

## 2022-09-14 ENCOUNTER — Encounter: Payer: Self-pay | Admitting: Obstetrics

## 2022-09-14 VITALS — BP 116/66 | HR 90 | Wt 160.3 lb

## 2022-09-14 DIAGNOSIS — Z3401 Encounter for supervision of normal first pregnancy, first trimester: Secondary | ICD-10-CM

## 2022-09-14 DIAGNOSIS — Z3A15 15 weeks gestation of pregnancy: Secondary | ICD-10-CM

## 2022-09-14 DIAGNOSIS — Z3402 Encounter for supervision of normal first pregnancy, second trimester: Secondary | ICD-10-CM

## 2022-09-14 LAB — POCT URINALYSIS DIPSTICK OB
Bilirubin, UA: NEGATIVE
Blood, UA: NEGATIVE
Glucose, UA: NEGATIVE
Ketones, UA: NEGATIVE
Leukocytes, UA: NEGATIVE — AB
Nitrite, UA: NEGATIVE
POC,PROTEIN,UA: NEGATIVE
Spec Grav, UA: >=1.03 — AB (ref 1.010–1.025)
Urobilinogen, UA: 0.2 E.U./dL
pH, UA: 5 (ref 5.0–8.0)

## 2022-09-14 NOTE — Progress Notes (Signed)
Routine Prenatal Care Visit  Subjective  Karen May is a 27 y.o. [phone] at [redacted]w[redacted]d being seen today for ongoing prenatal care.  She is currently monitored for the following issues for this low-risk pregnancy and has Supervision of low-risk first pregnancy, unspecified trimester; Marijuana use; Vaginal bleeding; Anxiety and depression; Hemorrhoids during pregnancy; and Supervision of other normal pregnancy, antepartum on their problem list.  ----------------------------------------------------------------------------------- Patient reports no complaints.  She is having a gender reveal this wekend. Her husband knows the gender. She has a feeling this is a girl. Contractions: Not present. Vag. Bleeding: None.  Movement: Absent. Leaking Fluid denies.  ----------------------------------------------------------------------------------- The following portions of the patient's history were reviewed and updated as appropriate: allergies, current medications, past family history, past medical history, past social history, past surgical history and problem list. Problem list updated.  Objective  Blood pressure 116/66, pulse 90, weight 160 lb 4.8 oz (72.7 kg), last menstrual period 05/18/2022, currently breastfeeding. Pregravid weight 150 lb (68 kg) Total Weight Gain 10 lb 4.8 oz (4.672 kg) Urinalysis: Urine Protein Negative  Urine Glucose Negative  Fetal Status:     Movement: Absent     General:  Alert, oriented and cooperative. Patient is in no acute distress.  Skin: Skin is warm and dry. No rash noted.   Cardiovascular: Normal heart rate noted  Respiratory: Normal respiratory effort, no problems with respiration noted  Abdomen: Soft, gravid, appropriate for gestational age. Pain/Pressure: Absent     Pelvic:  Cervical exam deferred        Extremities: Normal range of motion.  Edema: None  Mental Status: Normal mood and affect. Normal behavior. Normal judgment and thought content.   Assessment    27 y.o. [id number] at [redacted]w[redacted]d by  03/03/2023, by Ultrasound presenting for routine prenatal visit  Plan   fourth Problems (from 07/14/22 to present)     Problem Noted Resolved   Supervision of other normal pregnancy, antepartum 07/14/2022 by Loran Senters, CMA No   Overview Addendum 07/25/2022 11:57 AM by Loran Senters, CMA     Clinical Staff Provider  Office Location  [facility] Dating  Not found.  Language  English Anatomy US    Flu Vaccine  offer Genetic Screen  NIPS:   TDaP vaccine   offer Hgb A1C or  GTT Early : Third trimester :   Covid declined   LAB RESULTS   Rhogam  --/--/O POS (06/19 1008)  Blood Type --/--/O POS (06/19 1008)   Feeding Plan breast Antibody NEG (06/19 1008)  Contraception pill Rubella    Circumcision yes RPR     Pediatrician  [facility]  HBsAg     Support Person [facility] HIV    Prenatal Classes no Varicella     GBS  (For PCN allergy, check sensitivities)   BTL Consent  Hep C     VBAC Consent  Pap Diagnosis  Date Value Ref Range Status  03/07/2021   Final   - Negative for intraepithelial lesion or malignancy (NILM)      Hgb Electro      CF      SMA                   Preterm labor symptoms and general obstetric precautions including but not limited to vaginal bleeding, contractions, leaking of fluid and fetal movement were reviewed in detail with the patient. Please refer to After Visit Summary for other counseling recommendations.  I have ordered her anatomy scan.  Return  in about 4 weeks (around 10/12/2022) for return OB.  Mirna Mires, CNM  09/14/2022 3:28 PM

## 2022-09-18 ENCOUNTER — Encounter: Payer: Self-pay | Admitting: Obstetrics and Gynecology

## 2022-09-21 ENCOUNTER — Ambulatory Visit (INDEPENDENT_AMBULATORY_CARE_PROVIDER_SITE_OTHER): Payer: Medicaid Other

## 2022-09-21 ENCOUNTER — Other Ambulatory Visit (HOSPITAL_COMMUNITY)
Admission: RE | Admit: 2022-09-21 | Discharge: 2022-09-21 | Disposition: A | Discharge status: Home / Self Care | Payer: Medicaid Other | Source: Ambulatory Visit | Attending: Advanced Practice Midwife | Admitting: Advanced Practice Midwife

## 2022-09-21 VITALS — BP 100/60 | Ht 65.0 in | Wt 162.0 lb

## 2022-09-21 DIAGNOSIS — Z113 Encounter for screening for infections with a predominantly sexual mode of transmission: Secondary | ICD-10-CM | POA: Insufficient documentation

## 2022-09-21 NOTE — Patient Instructions (Signed)
Chlamydia, Female  Chlamydia is a sexually transmitted infection (STI). This infection spreads through sexual contact. Chlamydia can occur in different areas of the body, including: The urethra. This is the part of the body that drains urine from the bladder. The cervix. This is the lowest part of the uterus. The throat. The rectum. This condition is not difficult to treat. However, if left untreated, chlamydia can lead to more serious health problems, including pelvic inflammatory disease (PID). PID can increase your risk of being unable to have children. In pregnant women, untreated chlamydia can cause serious complications during pregnancy or health problems for the baby after delivery. What are the causes? This condition is caused by a bacteria called Chlamydia trachomatis. The bacteria are spread from an infected partner during sexual activity. Chlamydia can spread through contact with the genitals, mouth, or rectum. What increases the risk? The following factors may make you more likely to develop this condition: Not using a condom the right way or not using a condom every time you have sex. Having a new sex partner or having more than one sex partner. Being sexually active before age 25. What are the signs or symptoms? In some cases, there are no symptoms, especially early in the infection. If symptoms develop, they may include: Urinating often, or a burning feeling during urination. Redness, soreness, or swelling of the vagina or rectum. Discharge coming from the vagina or rectum. Pain in the abdomen. Pain during sex. Bleeding between menstrual periods or irregular periods. How is this diagnosed? This condition may be diagnosed with: Urine tests. Swab tests. Depending on your symptoms, your health care provider may use a cotton swab to collect a fluid sample from your vagina, rectum, nose, or throat to test for the bacteria. A pelvic exam. How is this treated? This condition is  treated with antibiotic medicines. Follow these instructions at home: Sexual activity Tell your sex partner or partners about your infection. These include any partners for oral, anal, or vaginal sex that you have had within 60 days of when your symptoms started. Sex partners should also be treated, even if they have no signs of the infection. Do not have sex until you and your sex partners have completed treatment and your health care provider says it is okay. If your health care provider prescribed you a single-dose medicine as treatment, wait at least 7 days after taking the medicine before having sex. General instructions Take over-the-counter and prescription medicines as told by your health care provider. Finish all antibiotic medicine even when you start to feel better. It is up to you to get your test results. Ask your health care provider, or the department that is doing the test, when your results will be ready. Keep all follow-up visits. This is important. You may need to be tested for infection again 3 months after treatment. How is this prevented? You can lower your risk of getting chlamydia by: Using latex or polyurethane condoms correctly every time you have sex. Not having multiple sex partners. Asking if your sex partner has been tested for STIs and had negative results. Getting regular health screenings to check for STIs. Contact a health care provider if: You develop new symptoms or your symptoms are getting worse. Your symptoms do not get better after treatment. You have a fever or chills. You have pain during sex. You have irregular menstrual periods, or you have bleeding between periods or after sex. You develop flu-like symptoms, such as night sweats, sore throat,   or muscle aches. You are unable to take your antibiotic medicine as prescribed. Summary Chlamydia is a sexually transmitted infection (STI) that is caused by bacteria. This infection spreads through sexual  contact. This condition is treated with antibiotic medicines. If left untreated, chlamydia can lead to more serious health problems, including pelvic inflammatory disease (PID). Your sex partners will also need to be treated. Do not have sex until both you and your partner have been treated. Take medicines as directed by your health care provider and keep all follow-up visits to ensure your infection has been completely treated. This information is not intended to replace advice given to you by your health care provider. Make sure you discuss any questions you have with your health care provider. Document Revised: 03/07/2021 Document Reviewed: 03/07/2021 Elsevier Patient Education  2023 Elsevier Inc.  

## 2022-09-21 NOTE — Progress Notes (Signed)
    NURSE VISIT NOTE  Subjective:    Patient ID: Karen May, female    DOB: June 06, 1995, 27 y.o.   MRN: [id number]  HPI  Patient is a 27 y.o. G30P1021 female who presents for  test of cure for positive chlamydia. Denies abnormal vaginal bleeding or significant pelvic pain or fever. Patient has history of known exposure to STD.   Objective:    BP 100/60   Ht  (1.651 m)   Wt 162 lb (73.5 kg)   LMP 05/18/2022 (Approximate)   BMI 26.96 kg/m     Assessment:   1. Screening for STD (sexually transmitted disease)      Plan:   GC and chlamydia probe sent to lab. Treatment: Will wait for results to be treated if needed. ROV prn if symptoms persist or worsen.   Donnetta Hail, CMA

## 2022-09-25 ENCOUNTER — Other Ambulatory Visit: Payer: Self-pay

## 2022-09-25 DIAGNOSIS — B9689 Other specified bacterial agents as the cause of diseases classified elsewhere: Secondary | ICD-10-CM

## 2022-09-25 LAB — CERVICOVAGINAL ANCILLARY ONLY
Bacterial Vaginitis (gardnerella): POSITIVE — AB
Candida Glabrata: NEGATIVE
Candida Vaginitis: NEGATIVE
Chlamydia: NEGATIVE
Comment: NEGATIVE
Comment: NEGATIVE
Comment: NEGATIVE
Comment: NEGATIVE
Comment: NEGATIVE
Comment: NORMAL
Neisseria Gonorrhea: NEGATIVE
Trichomonas: NEGATIVE

## 2022-09-25 MED ORDER — METRONIDAZOLE 500 MG PO TABS: 500.0000 mg | ORAL_TABLET | Freq: Two times a day (BID) | ORAL | 0 refills | Status: DC

## 2022-09-27 NOTE — Telephone Encounter (Signed)
Error

## 2022-10-12 ENCOUNTER — Ambulatory Visit (INDEPENDENT_AMBULATORY_CARE_PROVIDER_SITE_OTHER): Payer: Medicaid Other

## 2022-10-12 ENCOUNTER — Ambulatory Visit (INDEPENDENT_AMBULATORY_CARE_PROVIDER_SITE_OTHER): Payer: Medicaid Other | Admitting: Advanced Practice Midwife

## 2022-10-12 ENCOUNTER — Encounter: Payer: Medicaid Other | Admitting: Advanced Practice Midwife

## 2022-10-12 VITALS — BP 102/58 | HR 71 | Wt 163.0 lb

## 2022-10-12 DIAGNOSIS — Z3480 Encounter for supervision of other normal pregnancy, unspecified trimester: Secondary | ICD-10-CM

## 2022-10-12 DIAGNOSIS — Z3A20 20 weeks gestation of pregnancy: Secondary | ICD-10-CM

## 2022-10-12 DIAGNOSIS — Z3482 Encounter for supervision of other normal pregnancy, second trimester: Secondary | ICD-10-CM

## 2022-10-12 DIAGNOSIS — Z369 Encounter for antenatal screening, unspecified: Secondary | ICD-10-CM

## 2022-10-12 DIAGNOSIS — Z3A19 19 weeks gestation of pregnancy: Secondary | ICD-10-CM

## 2022-10-12 DIAGNOSIS — Z3401 Encounter for supervision of normal first pregnancy, first trimester: Secondary | ICD-10-CM

## 2022-10-12 LAB — POCT URINALYSIS DIPSTICK OB
Bilirubin, UA: NEGATIVE
Blood, UA: NEGATIVE
Glucose, UA: NEGATIVE
Ketones, UA: NEGATIVE
Leukocytes, UA: NEGATIVE
Nitrite, UA: NEGATIVE
POC,PROTEIN,UA: NEGATIVE
Spec Grav, UA: 1.015 (ref 1.010–1.025)
Urobilinogen, UA: 0.2 E.U./dL
pH, UA: 7 (ref 5.0–8.0)

## 2022-10-12 NOTE — Progress Notes (Signed)
Routine Prenatal Care Visit  Subjective  Karen May is a 27 y.o. [phone] at [redacted]w[redacted]d being seen today for ongoing prenatal care.  She is currently monitored for the following issues for this low-risk pregnancy and has Anxiety and depression; Hemorrhoids during pregnancy; and Supervision of other normal pregnancy, antepartum on their problem list.  ----------------------------------------------------------------------------------- Patient reports  she is doing well. She feels some lower right pelvic pressure. Anatomy scan today after this visit .   Contractions: Not present. Vag. Bleeding: None.  Movement: Present. Leaking Fluid denies.  ----------------------------------------------------------------------------------- The following portions of the patient's history were reviewed and updated as appropriate: allergies, current medications, past family history, past medical history, past social history, past surgical history and problem list. Problem list updated.  Objective  Blood pressure (!) 102/58, pulse 71, weight 163 lb (73.9 kg), last menstrual period 05/18/2022 Pregravid weight 150 lb (68 kg) Total Weight Gain 13 lb (5.897 kg) Urinalysis: Urine Protein    Urine Glucose    Fetal Status: Fetal Heart Rate (bpm): 152 Fundal Height: 20 cm Movement: Present     General:  Alert, oriented and cooperative. Patient is in no acute distress.  Skin: Skin is warm and dry. No rash noted.   Cardiovascular: Normal heart rate noted  Respiratory: Normal respiratory effort, no problems with respiration noted  Abdomen: Soft, gravid, appropriate for gestational age. Pain/Pressure: Present     Pelvic:  Cervical exam deferred        Extremities: Normal range of motion.  Edema: None  Mental Status: Normal mood and affect. Normal behavior. Normal judgment and thought content.   Assessment   27 y.o. [id number] at [redacted]w[redacted]d by  03/03/2023, by Ultrasound presenting for routine prenatal visit  Plan   fourth  Problems (from 07/14/22 to present)     Problem Noted Resolved   Supervision of other normal pregnancy, antepartum 07/14/2022 by Loran Senters, CMA No   Overview Addendum 07/25/2022 11:57 AM by Loran Senters, CMA     Clinical Staff Provider  Office Location  [facility] Dating  Not found.  Language  English Anatomy US    Flu Vaccine  offer Genetic Screen  NIPS:   TDaP vaccine   offer Hgb A1C or  GTT Early : Third trimester :   Covid declined   LAB RESULTS   Rhogam  --/--/O POS (06/19 1008)  Blood Type --/--/O POS (06/19 1008)   Feeding Plan breast Antibody NEG (06/19 1008)  Contraception pill Rubella    Circumcision yes RPR     Pediatrician  [facility]  HBsAg     Support Person [facility] HIV    Prenatal Classes no Varicella     GBS  (For PCN allergy, check sensitivities)   BTL Consent  Hep C     VBAC Consent  Pap Diagnosis  Date Value Ref Range Status  03/07/2021   Final   - Negative for intraepithelial lesion or malignancy (NILM)      Hgb Electro      CF      SMA                   Preterm labor symptoms and general obstetric precautions including but not limited to vaginal bleeding, contractions, leaking of fluid and fetal movement were reviewed in detail with the patient. Please refer to After Visit Summary for other counseling recommendations.   Return in about 4 weeks (around 11/09/2022) for rob.  Tresea Mall, CNM 10/12/2022 2:57 PM

## 2022-11-09 ENCOUNTER — Ambulatory Visit (INDEPENDENT_AMBULATORY_CARE_PROVIDER_SITE_OTHER): Payer: Medicaid Other | Admitting: Certified Nurse Midwife

## 2022-11-09 ENCOUNTER — Encounter: Payer: Self-pay | Admitting: Certified Nurse Midwife

## 2022-11-09 VITALS — BP 106/64 | HR 76 | Wt 173.7 lb

## 2022-11-09 DIAGNOSIS — Z3482 Encounter for supervision of other normal pregnancy, second trimester: Secondary | ICD-10-CM

## 2022-11-09 DIAGNOSIS — Z3A23 23 weeks gestation of pregnancy: Secondary | ICD-10-CM

## 2022-11-09 LAB — POCT URINALYSIS DIPSTICK OB
Bilirubin, UA: NEGATIVE
Blood, UA: NEGATIVE
Glucose, UA: NEGATIVE
Ketones, UA: NEGATIVE
Leukocytes, UA: NEGATIVE
Nitrite, UA: NEGATIVE
POC,PROTEIN,UA: NEGATIVE
Spec Grav, UA: 1.01 (ref 1.010–1.025)
Urobilinogen, UA: 0.2 E.U./dL
pH, UA: 6 (ref 5.0–8.0)

## 2022-11-09 NOTE — Patient Instructions (Signed)
Oral Glucose Tolerance Test During Pregnancy Why am I having this test? The oral glucose tolerance test (OGTT) is done to check how your body processes blood sugar (glucose). This is one of several tests used to diagnose diabetes that develops during pregnancy (gestational diabetes mellitus). Gestational diabetes is a short-term form of diabetes that some women develop while they are pregnant. It usually occurs during the second trimester of pregnancy and goes away after delivery. Testing, or screening, for gestational diabetes usually occurs at weeks 24-28 of pregnancy. You may have the OGTT test after having a 1-hour glucose screening test if the results from that test indicate that you may have gestational diabetes. This test may also be needed if: You have a history of gestational diabetes. There is a history of giving birth to very large babies or of losing pregnancies (having stillbirths). You have signs and symptoms of diabetes, such as: Changes in your eyesight. Tingling or numbness in your hands or feet. Changes in hunger, thirst, and urination, and these are not explained by your pregnancy. What is being tested? This test measures the amount of glucose in your blood at different times during a period of 3 hours. This shows how well your body can process glucose. What kind of sample is taken?  Blood samples are required for this test. They are usually collected by inserting a needle into a blood vessel. How do I prepare for this test? For 3 days before your test, eat normally. Have plenty of carbohydrate-rich foods. Follow instructions from your health care provider about: Eating or drinking restrictions on the day of the test. You may be asked not to eat or drink anything other than water (to fast) starting 8-10 hours before the test. Changing or stopping your regular medicines. Some medicines may interfere with this test. Tell a health care provider about: All medicines you are  taking, including vitamins, herbs, eye drops, creams, and over-the-counter medicines. Any blood disorders you have. Any surgeries you have had. Any medical conditions you have. What happens during the test? First, your blood glucose will be measured. This is referred to as your fasting blood glucose because you fasted before the test. Then, you will drink a glucose solution that contains a certain amount of glucose. Your blood glucose will be measured again 1, 2, and 3 hours after you drink the solution. This test takes about 3 hours to complete. You will need to stay at the testing location during this time. During the testing period: Do not eat or drink anything other than the glucose solution. Do not exercise. Do not use any products that contain nicotine or tobacco, such as cigarettes, e-cigarettes, and chewing tobacco. These can affect your test results. If you need help quitting, ask your health care provider. The testing procedure may vary among health care providers and hospitals. How are the results reported? Your results will be reported as milligrams of glucose per deciliter of blood (mg/dL) or millimoles per liter (mmol/L). There is more than one source for screening and diagnosis reference values used to diagnose gestational diabetes. Your health care provider will compare your results to normal values that were established after testing a large group of people (reference values). Reference values may vary among labs and hospitals. For this test (Carpenter-Coustan), reference values are: Fasting: 95 mg/dL (5.3 mmol/L). 1 hour: 180 mg/dL (10.0 mmol/L). 2 hour: 155 mg/dL (8.6 mmol/L). 3 hour: 140 mg/dL (7.8 mmol/L). What do the results mean? Results below the reference values are   considered normal. If two or more of your blood glucose levels are at or above the reference values, you may be diagnosed with gestational diabetes. If only one level is high, your health care provider may  suggest repeat testing or other tests to confirm a diagnosis. Talk with your health care provider about what your results mean. Questions to ask your health care provider Ask your health care provider, or the department that is doing the test: When will my results be ready? How will I get my results? What are my treatment options? What other tests do I need? What are my next steps? Summary The oral glucose tolerance test (OGTT) is one of several tests used to diagnose diabetes that develops during pregnancy (gestational diabetes mellitus). Gestational diabetes is a short-term form of diabetes that some women develop while they are pregnant. You may have the OGTT test after having a 1-hour glucose screening test if the results from that test show that you may have gestational diabetes. You may also have this test if you have any symptoms or risk factors for this type of diabetes. Talk with your health care provider about what your results mean. This information is not intended to replace advice given to you by your health care provider. Make sure you discuss any questions you have with your health care provider. Document Revised: 12/20/2021 Document Reviewed: 10/23/2019 Elsevier Patient Education  2024 Elsevier Inc.  

## 2022-11-09 NOTE — Progress Notes (Signed)
ROB doing well, feeling good movement. Feeling pelvic pressure. Discussed belly band for abdominal support. Discussed glucose screen next visit. Information sheet given. She verbalizes and agrees. Follow up 4 wks.   Doreene Burke, CNM

## 2022-12-06 ENCOUNTER — Other Ambulatory Visit: Payer: Self-pay

## 2022-12-06 DIAGNOSIS — Z3483 Encounter for supervision of other normal pregnancy, third trimester: Secondary | ICD-10-CM

## 2022-12-06 DIAGNOSIS — Z3A28 28 weeks gestation of pregnancy: Secondary | ICD-10-CM

## 2022-12-07 ENCOUNTER — Encounter: Payer: Self-pay | Admitting: Advanced Practice Midwife

## 2022-12-07 ENCOUNTER — Other Ambulatory Visit: Payer: Medicaid Other

## 2022-12-07 ENCOUNTER — Ambulatory Visit (INDEPENDENT_AMBULATORY_CARE_PROVIDER_SITE_OTHER): Payer: Medicaid Other | Admitting: Advanced Practice Midwife

## 2022-12-07 VITALS — BP 109/63 | HR 88 | Wt 178.0 lb

## 2022-12-07 DIAGNOSIS — Z3A27 27 weeks gestation of pregnancy: Secondary | ICD-10-CM

## 2022-12-07 DIAGNOSIS — Z3480 Encounter for supervision of other normal pregnancy, unspecified trimester: Secondary | ICD-10-CM

## 2022-12-07 DIAGNOSIS — Z3482 Encounter for supervision of other normal pregnancy, second trimester: Secondary | ICD-10-CM

## 2022-12-07 LAB — POCT URINALYSIS DIPSTICK OB
Bilirubin, UA: NEGATIVE
Blood, UA: NEGATIVE
Glucose, UA: NEGATIVE
Ketones, UA: NEGATIVE
Leukocytes, UA: NEGATIVE
Nitrite, UA: NEGATIVE
POC,PROTEIN,UA: NEGATIVE
Spec Grav, UA: 1.01 (ref 1.010–1.025)
Urobilinogen, UA: 1 E.U./dL
pH, UA: 6 (ref 5.0–8.0)

## 2022-12-07 NOTE — Patient Instructions (Signed)

## 2022-12-07 NOTE — Progress Notes (Signed)
Routine Prenatal Care Visit  Subjective  Karen May is a 27 y.o. [phone] at [redacted]w[redacted]d being seen today for ongoing prenatal care.  She is currently monitored for the following issues for this low-risk pregnancy and has Anxiety and depression; Hemorrhoids during pregnancy; and Supervision of other normal pregnancy, antepartum on their problem list.  ----------------------------------------------------------------------------------- Patient reports no complaints.  She has 28 wk labs today.  Contractions: Not present. Vag. Bleeding: None.  Movement: Present. Leaking Fluid denies.  ----------------------------------------------------------------------------------- The following portions of the patient's history were reviewed and updated as appropriate: allergies, current medications, past family history, past medical history, past social history, past surgical history and problem list. Problem list updated.  Objective  Blood pressure 109/63, pulse 88, weight 178 lb (80.7 kg), last menstrual period 05/18/2022, currently breastfeeding. Pregravid weight 150 lb (68 kg) Total Weight Gain 28 lb (12.7 kg) Urinalysis: Urine Protein Negative  Urine Glucose Negative  Fetal Status: Fetal Heart Rate (bpm): 154 Fundal Height: 28 cm Movement: Present     General:  Alert, oriented and cooperative. Patient is in no acute distress.  Skin: Skin is warm and dry. No rash noted.   Cardiovascular: Normal heart rate noted  Respiratory: Normal respiratory effort, no problems with respiration noted  Abdomen: Soft, gravid, appropriate for gestational age. Pain/Pressure: Absent     Pelvic:  Cervical exam deferred        Extremities: Normal range of motion.  Edema: None  Mental Status: Normal mood and affect. Normal behavior. Normal judgment and thought content.   Assessment   27 y.o. [id number] at [redacted]w[redacted]d by  03/03/2023, by Ultrasound presenting for routine prenatal visit  Plan   fourth Problems (from 07/14/22 to  present)     Problem Noted Resolved   Supervision of other normal pregnancy, antepartum 07/14/2022 by Loran Senters, CMA No   Overview Addendum 07/25/2022 11:57 AM by Loran Senters, CMA     Clinical Staff Provider  Office Location  [facility] Dating  Not found.  Language  English Anatomy US    Flu Vaccine  offer Genetic Screen  NIPS:   TDaP vaccine   offer Hgb A1C or  GTT Early : Third trimester :   Covid declined   LAB RESULTS   Rhogam  --/--/O POS (06/19 1008)  Blood Type --/--/O POS (06/19 1008)   Feeding Plan breast Antibody NEG (06/19 1008)  Contraception pill Rubella    Circumcision yes RPR     Pediatrician  [facility]  HBsAg     Support Person [facility] HIV    Prenatal Classes no Varicella     GBS  (For PCN allergy, check sensitivities)   BTL Consent  Hep C     VBAC Consent  Pap Diagnosis  Date Value Ref Range Status  03/07/2021   Final   - Negative for intraepithelial lesion or malignancy (NILM)      Hgb Electro      CF      SMA                    Preterm labor symptoms and general obstetric precautions including but not limited to vaginal bleeding, contractions, leaking of fluid and fetal movement were reviewed in detail with the patient. Please refer to After Visit Summary for other counseling recommendations.   Return in about 2 weeks (around 12/21/2022) for rob.  Tresea Mall, CNM 12/07/2022 3:18 PM

## 2022-12-08 LAB — 28 WEEK RH+PANEL
Basophils Absolute: 0 10*3/uL (ref 0.0–0.2)
Basos: 0 %
EOS (ABSOLUTE): 0.1 10*3/uL (ref 0.0–0.4)
Eos: 1 %
Gestational Diabetes Screen: 113 mg/dL (ref 70–139)
HIV Screen 4th Generation wRfx: NONREACTIVE
Hematocrit: 34.7 % (ref 34.0–46.6)
Hemoglobin: 11.4 g/dL (ref 11.1–15.9)
Immature Grans (Abs): 0.1 10*3/uL (ref 0.0–0.1)
Immature Granulocytes: 1 %
Lymphocytes Absolute: 1.5 10*3/uL (ref 0.7–3.1)
Lymphs: 15 %
MCH: 27.7 pg (ref 26.6–33.0)
MCHC: 32.9 g/dL (ref 31.5–35.7)
MCV: 84 fL (ref 79–97)
Monocytes Absolute: 0.7 10*3/uL (ref 0.1–0.9)
Monocytes: 7 %
Neutrophils Absolute: 7.6 10*3/uL — ABNORMAL HIGH (ref 1.4–7.0)
Neutrophils: 76 %
Platelets: 214 10*3/uL (ref 150–450)
RBC: 4.12 x10E6/uL (ref 3.77–5.28)
RDW: 17.5 % — ABNORMAL HIGH (ref 11.7–15.4)
RPR Ser Ql: NONREACTIVE
WBC: 10 10*3/uL (ref 3.4–10.8)

## 2022-12-21 ENCOUNTER — Encounter: Payer: Self-pay | Admitting: Advanced Practice Midwife

## 2022-12-21 ENCOUNTER — Ambulatory Visit (INDEPENDENT_AMBULATORY_CARE_PROVIDER_SITE_OTHER): Payer: Medicaid Other | Admitting: Advanced Practice Midwife

## 2022-12-21 VITALS — BP 104/49 | HR 84 | Wt 182.6 lb

## 2022-12-21 DIAGNOSIS — Z3483 Encounter for supervision of other normal pregnancy, third trimester: Secondary | ICD-10-CM

## 2022-12-21 DIAGNOSIS — Z3A29 29 weeks gestation of pregnancy: Secondary | ICD-10-CM

## 2022-12-21 LAB — POCT URINALYSIS DIPSTICK
Bilirubin, UA: NEGATIVE
Blood, UA: NEGATIVE
Glucose, UA: NEGATIVE
Ketones, UA: NEGATIVE
Leukocytes, UA: NEGATIVE
Nitrite, UA: NEGATIVE
Protein, UA: NEGATIVE
Spec Grav, UA: 1.015 (ref 1.010–1.025)
Urobilinogen, UA: 0.2 E.U./dL
pH, UA: 6.5 (ref 5.0–8.0)

## 2022-12-21 NOTE — Progress Notes (Addendum)
Routine Prenatal Care Visit  Subjective  Karen May is a 27 y.o. [phone] at [redacted]w[redacted]d being seen today for ongoing prenatal care.  She is currently monitored for the following issues for this low-risk pregnancy and has Anxiety and depression; Hemorrhoids during pregnancy; and Supervision of other normal pregnancy, antepartum on their problem list.  ----------------------------------------------------------------------------------- Patient reports  generally doing well. She has some muscle soreness due to recent trip to [place]. Contractions: Not present. Vag. Bleeding: None.  Movement: Present. Leaking Fluid denies.  ----------------------------------------------------------------------------------- The following portions of the patient's history were reviewed and updated as appropriate: allergies, current medications, past family history, past medical history, past social history, past surgical history and problem list. Problem list updated.  Objective  Blood pressure (!) 104/49, pulse 84, weight 182 lb 9.6 oz (82.8 kg), last menstrual period 05/18/2022 Pregravid weight 150 lb (68 kg) Total Weight Gain 32 lb 9.6 oz (14.8 kg) Urinalysis: Urine Protein    Urine Glucose    Fetal Status: Fetal Heart Rate (bpm): 131 Fundal Height: 30 cm Movement: Present     General:  Alert, oriented and cooperative. Patient is in no acute distress.  Skin: Skin is warm and dry. No rash noted.   Cardiovascular: Normal heart rate noted  Respiratory: Normal respiratory effort, no problems with respiration noted  Abdomen: Soft, gravid, appropriate for gestational age. Pain/Pressure: Absent     Pelvic:  Cervical exam deferred        Extremities: Normal range of motion.  Edema: None  Mental Status: Normal mood and affect. Normal behavior. Normal judgment and thought content.   Assessment   27 y.o. [id number] at [redacted]w[redacted]d by  03/03/2023, by Ultrasound presenting for routine prenatal visit  Plan   fourth Problems  (from 07/14/22 to present)     Problem Noted Resolved   Supervision of other normal pregnancy, antepartum 07/14/2022 by Loran Senters, CMA No   Overview Addendum 07/25/2022 11:57 AM by Loran Senters, CMA     Clinical Staff Provider  Office Location  [facility] Dating  6 week u/s  Language  English Anatomy US    Flu Vaccine  offer Genetic Screen  NIPS: negative, female  TDaP vaccine   offer Hgb A1C or  GTT Early : NA Third trimester : 113  Covid declined   LAB RESULTS   Rhogam  --/--/O POS (06/19 1008)  Blood Type --/--/O POS (06/19 1008)   Feeding Plan breast Antibody NEG (06/19 1008)  Contraception pill Rubella    Circumcision yes RPR     Pediatrician  [facility]  HBsAg     Support Person Devon HIV    Prenatal Classes no Varicella     GBS  (For PCN allergy, check sensitivities)   BTL Consent  Hep C     VBAC Consent  Pap Diagnosis  Date Value Ref Range Status  03/07/2021   Final   - Negative for intraepithelial lesion or malignancy (NILM)      Hgb Electro      CF      SMA                    Preterm labor symptoms and general obstetric precautions including but not limited to vaginal bleeding, contractions, leaking of fluid and fetal movement were reviewed in detail with the patient. Please refer to After Visit Summary for other counseling recommendations.   Return in about 2 weeks (around 01/04/2023) for rob.  Tresea Mall, CNM 12/21/2022 4:26 PM

## 2022-12-21 NOTE — Progress Notes (Deleted)
Routine Prenatal Care Visit  Subjective  Karen May is a 27 y.o. [phone] at [redacted]w[redacted]d being seen today for ongoing prenatal care.  She is currently monitored for the following issues for this low-risk pregnancy and has Anxiety and depression; Hemorrhoids during pregnancy; and Supervision of other normal pregnancy, antepartum on their problem list.  ----------------------------------------------------------------------------------- Patient reports {sx:14538}.   Contractions: Not present. Vag. Bleeding: None.  Movement: Present. Leaking Fluid {Actions; denies/reports/admits to:19208}.  ----------------------------------------------------------------------------------- The following portions of the patient's history were reviewed and updated as appropriate: allergies, current medications, past family history, past medical history, past social history, past surgical history and problem list. Problem list updated.  Objective  Blood pressure (!) 104/49, pulse 84, weight 182 lb 9.6 oz (82.8 kg), last menstrual period 05/18/2022, currently breastfeeding. Pregravid weight 150 lb (68 kg) Total Weight Gain 32 lb 9.6 oz (14.8 kg) Urinalysis: Urine Protein    Urine Glucose    Fetal Status:     Movement: Present     General:  Alert, oriented and cooperative. Patient is in no acute distress.  Skin: Skin is warm and dry. No rash noted.   Cardiovascular: Normal heart rate noted  Respiratory: Normal respiratory effort, no problems with respiration noted  Abdomen: Soft, gravid, appropriate for gestational age. Pain/Pressure: Present     Pelvic:  {Blank single:19197::"Cervical exam performed","Cervical exam deferred"}        Extremities: Normal range of motion.  Edema: None  Mental Status: Normal mood and affect. Normal behavior. Normal judgment and thought content.   Assessment   27 y.o. [id number] at [redacted]w[redacted]d by  03/03/2023, by Ultrasound presenting for {Blank single:19197::"routine","work-in"} prenatal  visit  Plan   fourth Problems (from 07/14/22 to present)     Problem Noted Resolved   Supervision of other normal pregnancy, antepartum 07/14/2022 by Loran Senters, CMA No   Overview Addendum 07/25/2022 11:57 AM by Loran Senters, CMA     Clinical Staff Provider  Office Location  [facility] Dating  Not found.  Language  English Anatomy US    Flu Vaccine  offer Genetic Screen  NIPS:   TDaP vaccine   offer Hgb A1C or  GTT Early : Third trimester :   Covid declined   LAB RESULTS   Rhogam  --/--/O POS (06/19 1008)  Blood Type --/--/O POS (06/19 1008)   Feeding Plan breast Antibody NEG (06/19 1008)  Contraception pill Rubella    Circumcision yes RPR     Pediatrician  [facility]  HBsAg     Support Person Devon HIV    Prenatal Classes no Varicella     GBS  (For PCN allergy, check sensitivities)   BTL Consent  Hep C     VBAC Consent  Pap Diagnosis  Date Value Ref Range Status  03/07/2021   Final   - Negative for intraepithelial lesion or malignancy (NILM)      Hgb Electro      CF      SMA                    {Blank single:19197::"Term","Preterm"} labor symptoms and general obstetric precautions including but not limited to vaginal bleeding, contractions, leaking of fluid and fetal movement were reviewed in detail with the patient. Please refer to After Visit Summary for other counseling recommendations.   No follow-ups on file.  Tresea Mall, CNM 12/21/2022 4:14 PM

## 2023-01-09 ENCOUNTER — Encounter: Payer: Self-pay | Admitting: Advanced Practice Midwife

## 2023-01-09 ENCOUNTER — Ambulatory Visit (INDEPENDENT_AMBULATORY_CARE_PROVIDER_SITE_OTHER): Payer: Medicaid Other | Admitting: Advanced Practice Midwife

## 2023-01-09 VITALS — BP 120/65 | HR 96 | Wt 183.2 lb

## 2023-01-09 DIAGNOSIS — Z3483 Encounter for supervision of other normal pregnancy, third trimester: Secondary | ICD-10-CM

## 2023-01-09 DIAGNOSIS — Z3480 Encounter for supervision of other normal pregnancy, unspecified trimester: Secondary | ICD-10-CM

## 2023-01-09 DIAGNOSIS — Z3A32 32 weeks gestation of pregnancy: Secondary | ICD-10-CM

## 2023-01-09 LAB — POCT URINALYSIS DIPSTICK
Bilirubin, UA: NEGATIVE
Blood, UA: NEGATIVE
Glucose, UA: NEGATIVE
Ketones, UA: NEGATIVE
Leukocytes, UA: NEGATIVE
Nitrite, UA: NEGATIVE
Protein, UA: NEGATIVE
Spec Grav, UA: 1.02 (ref 1.010–1.025)
Urobilinogen, UA: 0.2 E.U./dL
pH, UA: 6.5 (ref 5.0–8.0)

## 2023-01-09 NOTE — Progress Notes (Addendum)
Routine Prenatal Care Visit  Subjective  Karen May is a 27 y.o. [phone] at [redacted]w[redacted]d being seen today for ongoing prenatal care.  She is currently monitored for the following issues for this low-risk pregnancy and has Anxiety and depression; Hemorrhoids during pregnancy; and Supervision of other normal pregnancy, antepartum on their problem list.  ----------------------------------------------------------------------------------- Patient reports  she is doing well .  No concerns. Contractions: Not present. Vag. Bleeding: None.  Movement: Present. Leaking Fluid denies.  ----------------------------------------------------------------------------------- The following portions of the patient's history were reviewed and updated as appropriate: allergies, current medications, past family history, past medical history, past social history, past surgical history and problem list. Problem list updated.  Objective  Blood pressure 120/65, pulse 96, weight 183 lb 3.2 oz (83.1 kg), last menstrual period 05/18/2022 Pregravid weight 150 lb (68 kg) Total Weight Gain 33 lb 3.2 oz (15.1 kg) Urinalysis: Urine Protein    Urine Glucose    Fetal Status: Fetal Heart Rate (bpm): 143 Fundal Height: 33 cm Movement: Present     General:  Alert, oriented and cooperative. Patient is in no acute distress.  Skin: Skin is warm and dry. No rash noted.   Cardiovascular: Normal heart rate noted  Respiratory: Normal respiratory effort, no problems with respiration noted  Abdomen: Soft, gravid, appropriate for gestational age. Pain/Pressure: Absent     Pelvic:  Cervical exam deferred        Extremities: Normal range of motion.  Edema: None  Mental Status: Normal mood and affect. Normal behavior. Normal judgment and thought content.   Assessment   27 y.o. [id number] at [redacted]w[redacted]d by  03/03/2023, by Ultrasound presenting for routine prenatal visit  Plan   fourth Problems (from 07/14/22 to present)     Problem Noted Resolved    Supervision of other normal pregnancy, antepartum 07/14/2022 by Loran Senters, CMA No   Overview Addendum 12/21/2022  4:33 PM by Tresea Mall, CNM     Clinical Staff Provider  Office Location  [facility] Dating  6 week u/s  Language  English Anatomy US    Flu Vaccine  offer Genetic Screen  NIPS: negative, female  TDaP vaccine   offer Hgb A1C or  GTT Early : NA Third trimester : 113  Covid declined   LAB RESULTS   Rhogam  --/--/O POS (06/19 1008)  Blood Type --/--/O POS (06/19 1008)   Feeding Plan breast Antibody NEG (06/19 1008)  Contraception pill Rubella    Circumcision yes RPR     Pediatrician  [facility]  HBsAg     Support Person Devon HIV    Prenatal Classes no Varicella     GBS  (For PCN allergy, check sensitivities)   BTL Consent  Hep C     VBAC Consent  Pap Diagnosis  Date Value Ref Range Status  03/07/2021   Final   - Negative for intraepithelial lesion or malignancy (NILM)      Hgb Electro      CF      SMA                    Preterm labor symptoms and general obstetric precautions including but not limited to vaginal bleeding, contractions, leaking of fluid and fetal movement were reviewed in detail with the patient. Please refer to After Visit Summary for other counseling recommendations.   Return in about 2 weeks (around 01/23/2023) for rob.  Tresea Mall, CNM 01/09/2023 4:01 PM

## 2023-01-13 ENCOUNTER — Encounter: Payer: Self-pay | Admitting: Intensive Care

## 2023-01-13 ENCOUNTER — Other Ambulatory Visit: Payer: Self-pay

## 2023-01-13 ENCOUNTER — Emergency Department
Admission: EM | Admit: 2023-01-13 | Discharge: 2023-01-13 | Disposition: A | Discharge status: Home / Self Care | Payer: Medicaid Other | Attending: Emergency Medicine | Admitting: Emergency Medicine

## 2023-01-13 DIAGNOSIS — O2243 Hemorrhoids in pregnancy, third trimester: Secondary | ICD-10-CM | POA: Diagnosis present

## 2023-01-13 DIAGNOSIS — Z3A33 33 weeks gestation of pregnancy: Secondary | ICD-10-CM | POA: Diagnosis not present

## 2023-01-13 DIAGNOSIS — K645 Perianal venous thrombosis: Secondary | ICD-10-CM

## 2023-01-13 DIAGNOSIS — K649 Unspecified hemorrhoids: Secondary | ICD-10-CM

## 2023-01-13 MED ORDER — FENTANYL CITRATE PF 50 MCG/ML IJ SOSY
12.5000 ug | PREFILLED_SYRINGE | Freq: Once | INTRAMUSCULAR | Status: AC
  Administered 2023-01-13: 12.5 ug via INTRAMUSCULAR
  Filled 2023-01-13: qty 1

## 2023-01-13 MED ORDER — LIDOCAINE-EPINEPHRINE (PF) 2 %-1:200000 IJ SOLN
10.0000 mL | Freq: Once | INTRAMUSCULAR | Status: DC
  Filled 2023-01-13: qty 20

## 2023-01-13 MED ORDER — HYDROCORTISONE ACETATE 25 MG RE SUPP: 25.0000 mg | RECTAL | 0 refills | Status: AC | PRN

## 2023-01-13 NOTE — Discharge Instructions (Addendum)
You were evaluated in the emergency department for thrombosed hemorrhoids.  Evaluation was performed and you tolerated this procedure with no complications.  Hemorrhoid suppositories were sent to your pharmacy.  Please run this by your OB/GYN for usage but it should be okay during your third trimester.   Increase your fiber intake and each meals.  Sitz bath's twice daily are recommended.  Take Tylenol for pain as needed.

## 2023-01-13 NOTE — ED Triage Notes (Signed)
Patient is [redacted] weeks pregnant. Reports she has hemorrhoids causing her lots of pain. History of same with last pregnancy

## 2023-01-13 NOTE — ED Provider Notes (Signed)
[facility] Emergency Department Provider Note     Event Date/Time   First MD Initiated Contact with Patient 01/13/23 1506     (approximate)   History   Hemorrhoids   HPI  Karen May is a 27 y.o. female [redacted] weeks pregnant who presents to the ED with complaint of hemorrhoids with increasing severity x 2 days.  Patient has a history of hemorrhoids and reports having the same occurrence with previous pregnancy.  Patient was seen yesterday at her primary care and provided with Anorectal lidocaine with no relief.  Patient denies fever and abdominal pain.     Physical Exam   Triage Vital Signs: ED Triage Vitals [01/13/23 1423]  Encounter Vitals Group     BP 123/78     Systolic BP Percentile      Diastolic BP Percentile      Pulse Rate (!) 102     Resp 18     Temp 98.6 F (37 C)     Temp Source Oral     SpO2 100 %     Weight 184 lb (83.5 kg)     Height 5\' 6"  (1.676 m)     Head Circumference      Peak Flow      Pain Score 9     Pain Loc      Pain Education      Exclude from Growth Chart     Most recent vital signs: Vitals:   01/13/23 1423  BP: 123/78  Pulse: (!) 102  Resp: 18  Temp: 98.6 F (37 C)  SpO2: 100%    General Awake, no distress.  Tearful. HEENT NCAT. PERRL. EOMI. No rhinorrhea. Mucous membranes are moist.  CV:  Good peripheral perfusion.  RESP:  Normal effort.  ABD:  No distention.  Other:  Large, firm perianal mass.  Purpleish discoloration.  Tenderness out of proportion.  Moderate swelling.    ED Results / Procedures / Treatments   Labs (all labs ordered are listed, but only abnormal results are displayed) Labs Reviewed - No data to display  No results found.  PROCEDURES:  Critical Care performed: No  ..Incision and Drainage  Date/Time: 01/13/2023 5:22 PM  Performed by: Kern Reap A, PA-C Authorized by: Kern Reap A, PA-C   Consent:    Consent obtained:  Verbal   Consent given by:   Patient   Risks discussed:  Pain, incomplete drainage and bleeding Location:    Type:  External thrombosed hemorrhoid   Size:  Large ~ 4x3 cm Pre-procedure details:    Skin preparation:  Povidone-iodine Anesthesia:    Anesthesia method:  Local infiltration   Local anesthetic:  Lidocaine 2% WITH epi Procedure type:    Complexity:  Complex Procedure details:    Incision types:  Stab incision   Wound management:  Extensive cleaning   Drainage:  Bloody (blood clots extracted) Post-procedure details:    Procedure completion:  Tolerated    MEDICATIONS ORDERED IN ED: Medications  lidocaine-EPINEPHrine (XYLOCAINE W/EPI) 2 %-1:200000 (PF) injection 10 mL (has no administration in time range)  fentaNYL (SUBLIMAZE) injection 12.5 mcg (12.5 mcg Intramuscular Given 01/13/23 1539)    IMPRESSION / MDM / ASSESSMENT AND PLAN / ED COURSE  I reviewed the triage vital signs and the nursing notes.                               27  y.o. female presents to the emergency department for evaluation and treatment of hemorrhoids. See HPI for further details.   Differential diagnosis includes, but is not limited to hemorrhoids, abscess, fistula.   Patient's presentation is most consistent with acute complicated illness / injury requiring diagnostic workup.  Presentation is clinically consistent with thrombosed external hemorrhoids given stated physical exam findings above. Plan is to I&D following administration of pain medicine. Please see procedure note.  Patient tolerated well.  Patient is in stable condition for discharge home with prescription for Anusol suppository.  Recommended to consult with OB/GYN prior to suppository use, however it was discussed this medication can be taken during her her third trimester of pregnancy. Patient is given ED precautions to return to the ED for any worsening or new symptoms. Patient verbalizes understanding. All questions and concerns were addressed during ED visit.      FINAL CLINICAL IMPRESSION(S) / ED DIAGNOSES   Final diagnoses:  Perianal venous thrombosis  Hemorrhoids, unspecified hemorrhoid type   Rx / DC Orders   ED Discharge Orders          Ordered    hydrocortisone (ANUSOL-HC) 25 MG suppository  As needed        01/13/23 1623           Note:  This document was prepared using Dragon voice recognition software and may include unintentional dictation errors.    Romeo Apple, Jae Bruck A, PA-C 01/13/23 1728    Janith Lima, MD 01/13/23 [phone]

## 2023-01-17 ENCOUNTER — Encounter: Payer: Self-pay | Admitting: Advanced Practice Midwife

## 2023-01-18 ENCOUNTER — Encounter: Payer: Self-pay | Admitting: Advanced Practice Midwife

## 2023-01-23 ENCOUNTER — Encounter: Payer: Self-pay | Admitting: Obstetrics and Gynecology

## 2023-01-23 ENCOUNTER — Ambulatory Visit (INDEPENDENT_AMBULATORY_CARE_PROVIDER_SITE_OTHER): Payer: Medicaid Other | Admitting: Obstetrics and Gynecology

## 2023-01-23 VITALS — BP 108/56 | HR 79 | Wt 185.5 lb

## 2023-01-23 DIAGNOSIS — Z8719 Personal history of other diseases of the digestive system: Secondary | ICD-10-CM

## 2023-01-23 DIAGNOSIS — Z9889 Other specified postprocedural states: Secondary | ICD-10-CM

## 2023-01-23 DIAGNOSIS — Z3A34 34 weeks gestation of pregnancy: Secondary | ICD-10-CM

## 2023-01-23 DIAGNOSIS — Z3483 Encounter for supervision of other normal pregnancy, third trimester: Secondary | ICD-10-CM

## 2023-01-23 LAB — POCT URINALYSIS DIPSTICK OB
Bilirubin, UA: NEGATIVE
Blood, UA: NEGATIVE
Glucose, UA: NEGATIVE
Ketones, UA: NEGATIVE
Leukocytes, UA: NEGATIVE
Nitrite, UA: NEGATIVE
POC,PROTEIN,UA: NEGATIVE
Spec Grav, UA: 1.015 (ref 1.010–1.025)
Urobilinogen, UA: 0.2 E.U./dL
pH, UA: 7 (ref 5.0–8.0)

## 2023-01-23 NOTE — Progress Notes (Signed)
ROB [redacted]w[redacted]d: She is doing well. Has been having some pelvic pain in her groin that comes and goes. She reports good fetal movement.

## 2023-01-23 NOTE — Progress Notes (Signed)
ROB: Patient is a 27 y.o. [id number] at [redacted]w[redacted]d who presents for routine OB care.  Pregnancy is complicated by history of Anxiety and depression; Hemorrhoids during pregnancy.  Patient reports having to deal with a very bad flair of hemorrhoids ~ 2 weeks ago, was seen in the Urgent care on 8/16 and was given lidocaine cream which did not help. Was then seen in the ER on the following day and underwent hemorrhoidectomy. Notes after 1 week of recovery, is feeling much better. Reviewed birth plan, notes she plans for an epidural. Discussed contraception, will likely return to pills, but after discussion, may consider IUD as she does not think she desires future childbearing. Plans to breastfeed. Using [employer], desires circumcision. RTC in 2 weeks, for 36 week cultures then.

## 2023-01-26 ENCOUNTER — Telehealth: Payer: Self-pay

## 2023-01-26 NOTE — Telephone Encounter (Signed)
Synetta Fail from [employer] called wanting clarification in regards to FMLA form that was faxed, Synetta Fail states that question #7 on form is not clear and has questions. She states that you can call back group when available. KW

## 2023-02-06 ENCOUNTER — Ambulatory Visit (INDEPENDENT_AMBULATORY_CARE_PROVIDER_SITE_OTHER): Payer: Medicaid Other

## 2023-02-06 ENCOUNTER — Other Ambulatory Visit (HOSPITAL_COMMUNITY)
Admission: RE | Admit: 2023-02-06 | Discharge: 2023-02-06 | Disposition: A | Discharge status: Home / Self Care | Payer: Medicaid Other | Source: Ambulatory Visit

## 2023-02-06 VITALS — BP 112/70 | HR 87 | Wt 188.0 lb

## 2023-02-06 DIAGNOSIS — Z3685 Encounter for antenatal screening for Streptococcus B: Secondary | ICD-10-CM | POA: Insufficient documentation

## 2023-02-06 DIAGNOSIS — Z3480 Encounter for supervision of other normal pregnancy, unspecified trimester: Secondary | ICD-10-CM | POA: Diagnosis present

## 2023-02-06 DIAGNOSIS — Z3A36 36 weeks gestation of pregnancy: Secondary | ICD-10-CM | POA: Insufficient documentation

## 2023-02-06 DIAGNOSIS — Z113 Encounter for screening for infections with a predominantly sexual mode of transmission: Secondary | ICD-10-CM

## 2023-02-06 NOTE — Progress Notes (Signed)
    Return Prenatal Note   Assessment/Plan   Plan  27 y.o. [id number] at [redacted]w[redacted]d presents for follow-up OB visit. Reviewed prenatal record including previous visit note.  Supervision of other normal pregnancy, antepartum GBS and GC/CT screening swabs collected today. Provided resources for encouraging timely labor.  Reviewed labor warning signs and expectations for birth. Instructed to call office or come to hospital with persistent headache, vision changes, regular contractions, leaking of fluid, decreased fetal movement or vaginal bleeding.   Orders Placed This Encounter  Procedures   Strep Gp B NAA   Return in about 1 week (around 02/13/2023) for ROB, Already scheduled appointment.   Future Appointments  Date Time Provider Department Center  02/14/2023  3:55 PM Faline Langer, Lindalou Hose, CNM [facility] None  02/21/2023  3:55 PM Dominic, Courtney Heys, CNM [facility] None  02/28/2023  3:55 PM Mirna Mires, CNM [facility] None    For next visit:  continue with routine prenatal care     Subjective   27 y.o. [id number] at [redacted]w[redacted]d presents for this follow-up prenatal visit.  Patient has no concerns. Patient reports: Movement: Present Contractions: Irritability  Objective   Flow sheet Vitals: Pulse Rate: 87 BP: 112/70 Fundal Height: 37 cm Fetal Heart Rate (bpm): 125 Presentation: Vertex Total weight gain: 38 lb (17.2 kg)  General Appearance  No acute distress, well appearing, and well nourished Pulmonary   Normal work of breathing Neurologic   Alert and oriented to person, place, and time Psychiatric   Mood and affect within normal limits  Lindalou Hose Philbert Ocallaghan, CNM  09/10/244:31 PM

## 2023-02-06 NOTE — Assessment & Plan Note (Signed)
GBS and GC/CT screening swabs collected today. Provided resources for encouraging timely labor.  Reviewed labor warning signs and expectations for birth. Instructed to call office or come to hospital with persistent headache, vision changes, regular contractions, leaking of fluid, decreased fetal movement or vaginal bleeding.

## 2023-02-08 LAB — CERVICOVAGINAL ANCILLARY ONLY
Chlamydia: NEGATIVE
Comment: NEGATIVE
Comment: NORMAL
Neisseria Gonorrhea: NEGATIVE

## 2023-02-08 LAB — STREP GP B NAA: Strep Gp B NAA: NEGATIVE

## 2023-02-14 ENCOUNTER — Ambulatory Visit (INDEPENDENT_AMBULATORY_CARE_PROVIDER_SITE_OTHER): Payer: Medicaid Other

## 2023-02-14 VITALS — BP 110/67 | HR 98 | Wt 192.8 lb

## 2023-02-14 DIAGNOSIS — Z3A37 37 weeks gestation of pregnancy: Secondary | ICD-10-CM

## 2023-02-14 DIAGNOSIS — Z3483 Encounter for supervision of other normal pregnancy, third trimester: Secondary | ICD-10-CM

## 2023-02-14 DIAGNOSIS — Z3480 Encounter for supervision of other normal pregnancy, unspecified trimester: Secondary | ICD-10-CM

## 2023-02-14 DIAGNOSIS — Z23 Encounter for immunization: Secondary | ICD-10-CM | POA: Diagnosis not present

## 2023-02-14 LAB — POCT URINALYSIS DIPSTICK
Bilirubin, UA: NEGATIVE
Blood, UA: NEGATIVE
Glucose, UA: NEGATIVE
Ketones, UA: NEGATIVE
Leukocytes, UA: NEGATIVE
Nitrite, UA: NEGATIVE
Protein, UA: NEGATIVE
Spec Grav, UA: 1.015 (ref 1.010–1.025)
Urobilinogen, UA: 0.2 U/dL
pH, UA: 6.5 (ref 5.0–8.0)

## 2023-02-14 NOTE — Progress Notes (Signed)
    Return Prenatal Note   Assessment/Plan   Plan  27 y.o. [name] at [redacted]w[redacted]d presents for follow-up OB visit. Reviewed prenatal record including previous visit note.  Supervision of other normal pregnancy, antepartum - Reviewed labor warning signs and expectations for birth. Instructed to call office or come to hospital with persistent headache, vision changes, regular contractions, leaking of fluid, decreased fetal movement or vaginal bleeding.    Orders Placed This Encounter  Procedures   Tdap vaccine greater than or equal to 7yo IM   POCT Urinalysis Dipstick   Return in about 1 week (around 02/21/2023) for ROB.   Future Appointments  Date Time Provider Department Center  02/21/2023  3:55 PM Karen May, Karen May, CNM [facility] None  02/28/2023  3:55 PM Karen May, CNM [facility] None    For next visit:  continue with routine prenatal care     Subjective   27 y.o. [name] at [redacted]w[redacted]d presents for this follow-up prenatal visit.  Patient has no concerns. Has had some contractions but nothing consistent.  Patient reports: Movement: Present Contractions: Irritability  Objective   Flow sheet Vitals: Pulse Rate: 98 BP: 110/67 Fundal Height: 37 cm Fetal Heart Rate (bpm): 145 Presentation: Vertex Total weight gain: 42 lb 12.8 oz (19.4 kg)  General Appearance  No acute distress, well appearing, and well nourished Pulmonary   Normal work of breathing Neurologic   Alert and oriented to person, place, and time Psychiatric   Mood and affect within normal limits  Karen May, CNM  09/18/244:18 PM

## 2023-02-14 NOTE — Assessment & Plan Note (Signed)
-   Reviewed labor warning signs and expectations for birth. Instructed to call office or come to hospital with persistent headache, vision changes, regular contractions, leaking of fluid, decreased fetal movement or vaginal bleeding.

## 2023-02-21 ENCOUNTER — Encounter: Payer: Self-pay | Admitting: Licensed Practical Nurse

## 2023-02-21 ENCOUNTER — Ambulatory Visit: Payer: Medicaid Other | Admitting: Licensed Practical Nurse

## 2023-02-21 VITALS — BP 114/56 | HR 86 | Wt 192.9 lb

## 2023-02-21 DIAGNOSIS — Z23 Encounter for immunization: Secondary | ICD-10-CM

## 2023-02-21 DIAGNOSIS — Z2911 Encounter for prophylactic immunotherapy for respiratory syncytial virus (RSV): Secondary | ICD-10-CM

## 2023-02-21 DIAGNOSIS — Z3480 Encounter for supervision of other normal pregnancy, unspecified trimester: Secondary | ICD-10-CM

## 2023-02-21 DIAGNOSIS — Z3483 Encounter for supervision of other normal pregnancy, third trimester: Secondary | ICD-10-CM

## 2023-02-21 DIAGNOSIS — Z3A38 38 weeks gestation of pregnancy: Secondary | ICD-10-CM

## 2023-02-21 LAB — POCT URINALYSIS DIPSTICK
Bilirubin, UA: NEGATIVE
Blood, UA: NEGATIVE
Glucose, UA: NEGATIVE
Ketones, UA: NEGATIVE
Leukocytes, UA: NEGATIVE
Nitrite, UA: NEGATIVE
Protein, UA: NEGATIVE
Spec Grav, UA: 1.02 (ref 1.010–1.025)
Urobilinogen, UA: 0.2 E.U./dL
pH, UA: 7 (ref 5.0–8.0)

## 2023-02-21 NOTE — Progress Notes (Signed)
Routine Prenatal Care Visit  Subjective  Karen May is a 27 y.o. [phone] at [redacted]w[redacted]d being seen today for ongoing prenatal care.  She is currently monitored for the following issues for this low-risk pregnancy and has Anxiety and depression; Hemorrhoids during pregnancy; and Supervision of other normal pregnancy, antepartum on their problem list.  ----------------------------------------------------------------------------------- Patient reports no complaints.  Here with her son Karen May.  -feeling good. Has had B-H contractions. -Her partner and mother will be her labor support, he gets 6wks off -they have family nearby for PP support, reports her mood was great after the birth of Karen May.   Contractions: Irritability. Vag. Bleeding: None.  Movement: Present. Leaking Fluid denies.  ----------------------------------------------------------------------------------- The following portions of the patient's history were reviewed and updated as appropriate: allergies, current medications, past family history, past medical history, past social history, past surgical history and problem list. Problem list updated.  Objective  Blood pressure (!) 114/56, pulse 86, weight 192 lb 14.4 oz (87.5 kg), last menstrual period 05/18/2022, currently breastfeeding. Pregravid weight 150 lb (68 kg) Total Weight Gain 42 lb 14.4 oz (19.5 kg) Urinalysis: Urine Protein    Urine Glucose    Fetal Status: Fetal Heart Rate (bpm): 140 Fundal Height: 40 cm Movement: Present  Presentation: Vertex  General:  Alert, oriented and cooperative. Patient is in no acute distress.  Skin: Skin is warm and dry. No rash noted.   Cardiovascular: Normal heart rate noted  Respiratory: Normal respiratory effort, no problems with respiration noted  Abdomen: Soft, gravid, appropriate for gestational age. Pain/Pressure: Present     Pelvic:  Cervical exam deferred        Extremities: Normal range of motion.  Edema: None  Mental Status:  Normal mood and affect. Normal behavior. Normal judgment and thought content.   Assessment   27 y.o. [name] at [redacted]w[redacted]d by  03/03/2023, by Ultrasound presenting for routine prenatal visit  Plan   fourth Problems (from 07/14/22 to present)     Problem Noted Resolved   Supervision of other normal pregnancy, antepartum 07/14/2022 by Loran Senters, CMA No   Overview Addendum 01/09/2023  4:02 PM by Tresea Mall, CNM     Clinical Staff Provider  Office Location  [facility] Dating  6 week u/s  Language  English Anatomy US    Flu Vaccine  offer Genetic Screen  NIPS: negative, female  TDaP vaccine   offer Hgb A1C or  GTT Early : NA Third trimester : 113  Covid declined   LAB RESULTS   Rhogam  O/Positive/-- (03/07 1552)  Blood Type O/Positive/-- (03/07 1552)   Feeding Plan breast Antibody Negative (03/07 1552)  Contraception pill Rubella 2.30 (03/07 1552)  Circumcision yes RPR Non Reactive (07/11 1555)   Pediatrician  [facility]  HBsAg Negative (03/07 1552)   Support Person Devon HIV Non Reactive (07/11 1555)  Prenatal Classes no Varicella     GBS  (For PCN allergy, check sensitivities)   BTL Consent  Hep C Non Reactive (03/07 1552)   VBAC Consent  Pap Diagnosis  Date Value Ref Range Status  03/07/2021   Final   - Negative for intraepithelial lesion or malignancy (NILM)      Hgb Electro      CF      SMA                    Term labor symptoms and general obstetric precautions including but not limited to vaginal bleeding, contractions, leaking of  fluid and fetal movement were reviewed in detail with the patient. Please refer to After Visit Summary for other counseling recommendations.   Return in about 1 week (around 02/28/2023) for ROB.  RSV vaccine given   Carie Caddy, CNM   [facility]  02/21/23  4:24 PM

## 2023-02-28 ENCOUNTER — Ambulatory Visit (INDEPENDENT_AMBULATORY_CARE_PROVIDER_SITE_OTHER): Payer: Medicaid Other | Admitting: Obstetrics

## 2023-02-28 VITALS — BP 98/54 | HR 86 | Wt 189.0 lb

## 2023-02-28 DIAGNOSIS — Z3A39 39 weeks gestation of pregnancy: Secondary | ICD-10-CM

## 2023-02-28 DIAGNOSIS — Z3480 Encounter for supervision of other normal pregnancy, unspecified trimester: Secondary | ICD-10-CM

## 2023-02-28 LAB — POCT URINALYSIS DIPSTICK OB
Bilirubin, UA: NEGATIVE
Glucose, UA: NEGATIVE
Ketones, UA: NEGATIVE
Leukocytes, UA: NEGATIVE
Nitrite, UA: NEGATIVE
Spec Grav, UA: 1.02 (ref 1.010–1.025)
Urobilinogen, UA: 0.2 U/dL
pH, UA: 5 (ref 5.0–8.0)

## 2023-02-28 NOTE — Progress Notes (Signed)
Routine Prenatal Care Visit  Subjective  Karen May is a 27 y.o. [phone] at [redacted]w[redacted]d being seen today for ongoing prenatal care.  She is currently monitored for the following issues for this low-risk pregnancy and has Anxiety and depression; Hemorrhoids during pregnancy; and Supervision of other normal pregnancy, antepartum on their problem list.  ----------------------------------------------------------------------------------- Patient reports occasional contractions.  She thinks she passed her mucous plug Contractions: Irritability. Vag. Bleeding: None.  Movement: Present. Leaking Fluid denies.  ----------------------------------------------------------------------------------- The following portions of the patient's history were reviewed and updated as appropriate: allergies, current medications, past family history, past medical history, past social history, past surgical history and problem list. Problem list updated.  Objective  Blood pressure (!) 98/54, pulse 86, weight 189 lb (85.7 kg), last menstrual period 05/18/2022, currently breastfeeding. Pregravid weight 150 lb (68 kg) Total Weight Gain 39 lb (17.7 kg) Urinalysis: Urine Protein Trace  Urine Glucose Negative  Fetal Status: Fetal Heart Rate (bpm): 128 Fundal Height: 41 cm Movement: Present  Presentation: Vertex  General:  Alert, oriented and cooperative. Patient is in no acute distress.  Skin: Skin is warm and dry. No rash noted.   Cardiovascular: Normal heart rate noted  Respiratory: Normal respiratory effort, no problems with respiration noted  Abdomen: Soft, gravid, appropriate for gestational age. Pain/Pressure: Present     Pelvic:  Cervical exam performed Dilation: 3.5 Effacement (%): 50 Station: -3  Extremities: Normal range of motion.  Edema: None  Mental Status: Normal mood and affect. Normal behavior. Normal judgment and thought content.   Assessment   27 y.o. [id number] at [redacted]w[redacted]d by  03/03/2023, by Ultrasound  presenting for routine prenatal visit  Plan   fourth Problems (from 07/14/22 to present)     Problem Noted Resolved   Supervision of other normal pregnancy, antepartum 07/14/2022 by Loran Senters, CMA No   Overview Addendum 02/28/2023  5:38 PM by Mirna Mires, CNM     Clinical Staff Provider  Office Location  [facility] Dating  6 week u/s  Language  English Anatomy US  normal  Flu Vaccine  offer Genetic Screen  NIPS: negative, female  TDaP vaccine   02/14/23 Hgb A1C or  GTT Early : NA Third trimester : 113  RSV 02/21/23    Covid declined   LAB RESULTS   Rhogam  O/Positive/-- (03/07 1552)  Blood Type O/Positive/-- (03/07 1552)   Feeding Plan breast Antibody Negative (03/07 1552)  Contraception pill Rubella 2.30 (03/07 1552)  Circumcision yes RPR Non Reactive (07/11 1555)   Pediatrician  [facility]  HBsAg Negative (03/07 1552)   Support Person Devon HIV Non Reactive (07/11 1555)  Prenatal Classes no Varicella Immune    GBS  (For PCN allergy, check sensitivities) negative  BTL Consent  Hep C Non Reactive (03/07 1552)   VBAC Consent  Pap Diagnosis  Date Value Ref Range Status  03/07/2021   Final   - Negative for intraepithelial lesion or malignancy (NILM)      Hgb Electro      CF      SMA                    Term labor symptoms and general obstetric precautions including but not limited to vaginal bleeding, contractions, leaking of fluid and fetal movement were reviewed in detail with the patient. Please refer to After Visit Summary for other counseling recommendations.   She will RTC early next week if not delivered, and we will set  her up for IOL>  No follow-ups on file.  Mirna Mires, CNM  02/28/2023 5:40 PM

## 2023-03-01 ENCOUNTER — Inpatient Hospital Stay
Admission: EM | Admit: 2023-03-01 | Discharge: 2023-03-02 | DRG: 807 | Disposition: A | Discharge status: Home / Self Care | Payer: Medicaid Other | Attending: Obstetrics | Admitting: Obstetrics

## 2023-03-01 ENCOUNTER — Other Ambulatory Visit: Payer: Self-pay

## 2023-03-01 ENCOUNTER — Inpatient Hospital Stay: Payer: Medicaid Other | Admitting: General Practice

## 2023-03-01 ENCOUNTER — Observation Stay
Admission: EM | Admit: 2023-03-01 | Discharge: 2023-03-01 | Disposition: A | Discharge status: Home / Self Care | Payer: Medicaid Other | Source: Home / Self Care | Admitting: Obstetrics

## 2023-03-01 ENCOUNTER — Encounter: Payer: Self-pay | Admitting: Obstetrics

## 2023-03-01 ENCOUNTER — Encounter: Payer: Self-pay | Admitting: Obstetrics and Gynecology

## 2023-03-01 DIAGNOSIS — Z801 Family history of malignant neoplasm of trachea, bronchus and lung: Secondary | ICD-10-CM | POA: Diagnosis not present

## 2023-03-01 DIAGNOSIS — D649 Anemia, unspecified: Secondary | ICD-10-CM | POA: Diagnosis not present

## 2023-03-01 DIAGNOSIS — Z803 Family history of malignant neoplasm of breast: Secondary | ICD-10-CM

## 2023-03-01 DIAGNOSIS — Z833 Family history of diabetes mellitus: Secondary | ICD-10-CM | POA: Diagnosis not present

## 2023-03-01 DIAGNOSIS — O479 False labor, unspecified: Secondary | ICD-10-CM | POA: Diagnosis present

## 2023-03-01 DIAGNOSIS — Z3A39 39 weeks gestation of pregnancy: Secondary | ICD-10-CM

## 2023-03-01 DIAGNOSIS — Z3480 Encounter for supervision of other normal pregnancy, unspecified trimester: Principal | ICD-10-CM

## 2023-03-01 DIAGNOSIS — O26893 Other specified pregnancy related conditions, third trimester: Secondary | ICD-10-CM | POA: Diagnosis present

## 2023-03-01 DIAGNOSIS — O09523 Supervision of elderly multigravida, third trimester: Secondary | ICD-10-CM | POA: Insufficient documentation

## 2023-03-01 DIAGNOSIS — O9903 Anemia complicating the puerperium: Secondary | ICD-10-CM

## 2023-03-01 LAB — CBC
HCT: 45.4 % (ref 36.0–46.0)
Hemoglobin: 15.7 g/dL — ABNORMAL HIGH (ref 12.0–15.0)
MCH: 29.8 pg (ref 26.0–34.0)
MCHC: 34.6 g/dL (ref 30.0–36.0)
MCV: 86.1 fL (ref 80.0–100.0)
Platelets: 223 10*3/uL (ref 150–400)
RBC: 5.27 MIL/uL — ABNORMAL HIGH (ref 3.87–5.11)
RDW: 15.9 % — ABNORMAL HIGH (ref 11.5–15.5)
WBC: 14.6 10*3/uL — ABNORMAL HIGH (ref 4.0–10.5)
nRBC: 0 % (ref 0.0–0.2)

## 2023-03-01 LAB — TYPE AND SCREEN
ABO/RH(D): O POS
Antibody Screen: NEGATIVE

## 2023-03-01 MED ORDER — LIDOCAINE-EPINEPHRINE (PF) 1.5 %-1:200000 IJ SOLN
INTRAMUSCULAR | Status: DC | PRN
  Administered 2023-03-01: 3 mL via PERINEURAL

## 2023-03-01 MED ORDER — PRENATAL MULTIVITAMIN CH
1.0000 | ORAL_TABLET | Freq: Every day | ORAL | Status: DC
  Administered 2023-03-02: 1 via ORAL
  Filled 2023-03-01: qty 1

## 2023-03-01 MED ORDER — LIDOCAINE HCL (PF) 1 % IJ SOLN: 30.0000 mL | INTRAMUSCULAR | Status: DC | PRN

## 2023-03-01 MED ORDER — BUPIVACAINE HCL (PF) 0.25 % IJ SOLN
INTRAMUSCULAR | Status: DC | PRN
  Administered 2023-03-01 (×2): 4 mL via EPIDURAL

## 2023-03-01 MED ORDER — OXYCODONE-ACETAMINOPHEN 5-325 MG PO TABS: 2.0000 | ORAL_TABLET | ORAL | Status: DC | PRN

## 2023-03-01 MED ORDER — ACETAMINOPHEN 325 MG PO TABS: 650.0000 mg | ORAL_TABLET | ORAL | Status: DC | PRN

## 2023-03-01 MED ORDER — OXYCODONE-ACETAMINOPHEN 5-325 MG PO TABS: 1.0000 | ORAL_TABLET | ORAL | Status: DC | PRN

## 2023-03-01 MED ORDER — EPHEDRINE 5 MG/ML INJ: 10.0000 mg | INTRAVENOUS | Status: DC | PRN

## 2023-03-01 MED ORDER — OXYTOCIN BOLUS FROM INFUSION
333.0000 mL | Freq: Once | INTRAVENOUS | Status: AC
  Administered 2023-03-01: 333 mL via INTRAVENOUS

## 2023-03-01 MED ORDER — DOCUSATE SODIUM 100 MG PO CAPS
100.0000 mg | ORAL_CAPSULE | Freq: Two times a day (BID) | ORAL | Status: DC
  Administered 2023-03-02: 100 mg via ORAL
  Filled 2023-03-01: qty 1

## 2023-03-01 MED ORDER — SIMETHICONE 80 MG PO CHEW: 80.0000 mg | CHEWABLE_TABLET | ORAL | Status: DC | PRN

## 2023-03-01 MED ORDER — LACTATED RINGERS IV SOLN: 500.0000 mL | Freq: Once | INTRAVENOUS | Status: DC

## 2023-03-01 MED ORDER — FENTANYL CITRATE (PF) 100 MCG/2ML IJ SOLN
INTRAMUSCULAR | Status: AC
  Filled 2023-03-01: qty 2

## 2023-03-01 MED ORDER — ZOLPIDEM TARTRATE 5 MG PO TABS: 5.0000 mg | ORAL_TABLET | Freq: Every evening | ORAL | Status: DC | PRN

## 2023-03-01 MED ORDER — WITCH HAZEL-GLYCERIN EX PADS
1.0000 | MEDICATED_PAD | CUTANEOUS | Status: DC | PRN
  Administered 2023-03-01: 1 via TOPICAL
  Filled 2023-03-01: qty 100

## 2023-03-01 MED ORDER — COCONUT OIL OIL: 1.0000 | TOPICAL_OIL | Status: DC | PRN

## 2023-03-01 MED ORDER — DIBUCAINE (PERIANAL) 1 % EX OINT: 1.0000 | TOPICAL_OINTMENT | CUTANEOUS | Status: DC | PRN

## 2023-03-01 MED ORDER — FENTANYL-BUPIVACAINE-NACL 0.5-0.125-0.9 MG/250ML-% EP SOLN
12.0000 mL/h | EPIDURAL | Status: DC | PRN
  Administered 2023-03-01: 12 mL/h via EPIDURAL

## 2023-03-01 MED ORDER — BENZOCAINE-MENTHOL 20-0.5 % EX AERO: 1.0000 | INHALATION_SPRAY | CUTANEOUS | Status: DC | PRN

## 2023-03-01 MED ORDER — ACETAMINOPHEN 325 MG PO TABS
650.0000 mg | ORAL_TABLET | ORAL | Status: DC | PRN
  Administered 2023-03-01: 650 mg via ORAL
  Filled 2023-03-01: qty 2

## 2023-03-01 MED ORDER — ONDANSETRON HCL 4 MG PO TABS: 4.0000 mg | ORAL_TABLET | ORAL | Status: DC | PRN

## 2023-03-01 MED ORDER — ONDANSETRON HCL 4 MG/2ML IJ SOLN: 4.0000 mg | Freq: Four times a day (QID) | INTRAMUSCULAR | Status: DC | PRN

## 2023-03-01 MED ORDER — SOD CITRATE-CITRIC ACID 500-334 MG/5ML PO SOLN: 30.0000 mL | ORAL | Status: DC | PRN

## 2023-03-01 MED ORDER — LIDOCAINE HCL (PF) 1 % IJ SOLN
INTRAMUSCULAR | Status: DC | PRN
  Administered 2023-03-01: 3 mL

## 2023-03-01 MED ORDER — LACTATED RINGERS IV SOLN: 500.0000 mL | INTRAVENOUS | Status: DC | PRN

## 2023-03-01 MED ORDER — PHENYLEPHRINE 80 MCG/ML (10ML) SYRINGE FOR IV PUSH (FOR BLOOD PRESSURE SUPPORT): 80.0000 ug | PREFILLED_SYRINGE | INTRAVENOUS | Status: DC | PRN

## 2023-03-01 MED ORDER — DIPHENHYDRAMINE HCL 50 MG/ML IJ SOLN: 12.5000 mg | INTRAMUSCULAR | Status: DC | PRN

## 2023-03-01 MED ORDER — OXYTOCIN-SODIUM CHLORIDE 30-0.9 UT/500ML-% IV SOLN
2.5000 [IU]/h | INTRAVENOUS | Status: DC
  Administered 2023-03-01: 2.5 [IU]/h via INTRAVENOUS

## 2023-03-01 MED ORDER — LACTATED RINGERS IV SOLN: INTRAVENOUS | Status: DC

## 2023-03-01 MED ORDER — DIPHENHYDRAMINE HCL 25 MG PO CAPS: 25.0000 mg | ORAL_CAPSULE | Freq: Four times a day (QID) | ORAL | Status: DC | PRN

## 2023-03-01 MED ORDER — ONDANSETRON HCL 4 MG/2ML IJ SOLN: 4.0000 mg | INTRAMUSCULAR | Status: DC | PRN

## 2023-03-01 MED ORDER — FENTANYL CITRATE (PF) 100 MCG/2ML IJ SOLN
50.0000 ug | Freq: Once | INTRAMUSCULAR | Status: AC
  Administered 2023-03-01: 50 ug via INTRAVENOUS

## 2023-03-01 MED ORDER — IBUPROFEN 600 MG PO TABS
600.0000 mg | ORAL_TABLET | Freq: Four times a day (QID) | ORAL | Status: DC
  Administered 2023-03-01 – 2023-03-02 (×4): 600 mg via ORAL
  Filled 2023-03-01 (×5): qty 1

## 2023-03-01 NOTE — OB Triage Note (Addendum)
Pt presents to L&D reporting ctx starting last night around 2100. Reports ctx are now every 8-9 minutes apart. Pain 7/10 in lower abdomen with ctx. Reports losing her mucus plug this morning. Denies bleeding, LOF, and endorses positive fetal movement. VSS. Monitors applied and assessing.  Zehava Turski B. Jewelz Kobus, RN BSN 03/01/2023 9:23 AM

## 2023-03-01 NOTE — Discharge Summary (Signed)
Postpartum Discharge Summary  Date of Service updated: 03/02/2023     Patient Name: Karen May DOB: [birth year] MRN: [id number]  Date of admission: 03/01/2023 Delivery date:03/01/2023 Delivering provider: Mirna Mires Date of discharge: 03/02/2023  Admitting diagnosis: Uterine contractions [O47.9] Intrauterine pregnancy: [redacted]w[redacted]d     Secondary diagnosis:  Principal Problem:   Uterine contractions Active Problems:   Postpartum care following vaginal delivery   Encounter for care or examination of lactating mother  Additional problems: none    Discharge diagnosis: Term Pregnancy Delivered                                              Post partum procedures: NA Augmentation: N/A Complications: None  Hospital course: Onset of Labor With Vaginal Delivery      27 y.o. yo [id number] at [redacted]w[redacted]d was admitted in Active Labor on 03/01/2023. Labor course was complicated bynothing  Membrane Rupture Time/Date: 4:49 PM,03/01/2023  Delivery Method:Vaginal, Spontaneous Operative Delivery:N/A Episiotomy: None Lacerations:  None See delivery note for details  Patient had a postpartum course complicated by NA.  She is tolerating a regular diet, her pain is controlled with PO medications, she is ambulating and voiding without difficulty. She reports breastfeeding is going well.  Patient is discharged home in stable condition on 03/02/23.  Newborn Data: Birth date:03/01/2023 Birth time:5:03 PM Gender:Female Living status:Living Apgars:8 ,9  Weight:3350 g  Magnesium Sulfate received: No BMZ received: No Rhophylac:N/A MMR:No T-DaP:Given prenatally Flu: No RSV Vaccine received: Yes Transfusion:No Immunizations administered: Immunization History  Administered Date(s) Administered   PPD Test 02/04/2018   Rsv, Bivalent, Protein Subunit Rsvpref,pf Verdis Frederickson) 02/21/2023   Tdap 11/11/2019, 02/14/2023    Physical exam  Vitals:   03/01/23 2333 03/02/23 0335 03/02/23 0811 03/02/23 1610   BP: (!) 99/46 98/62 109/73 111/72  Pulse: 80 74 71 78  Resp: 17 17 18 20   Temp: 98.2 F (36.8 C) 98.1 F (36.7 C) 98.3 F (36.8 C) 98.6 F (37 C)  TempSrc: Oral Oral Oral   SpO2: 98% 98% 98% 100%  Weight:      Height:       General: alert, cooperative, and no distress Lochia: appropriate Uterine Fundus: firm Incision: N/A DVT Evaluation: No evidence of DVT seen on physical exam. Labs: Lab Results  Component Value Date   WBC 18.2 (H) 03/02/2023   HGB 13.3 03/02/2023   HCT 38.4 03/02/2023   MCV 86.1 03/02/2023   PLT 202 03/02/2023      Latest Ref Rng & Units 11/14/2021    8:48 AM  CMP  Glucose 70 - 99 mg/dL 244   BUN 6 - 20 mg/dL 15   Creatinine 0.10 - 1.00 mg/dL 2.72   Sodium 536 - 644 mmol/L 137   Potassium 3.5 - 5.1 mmol/L 3.4   Chloride 98 - 111 mmol/L 106   CO2 22 - 32 mmol/L 22   Calcium 8.9 - 10.3 mg/dL 8.9   Total Protein 6.5 - 8.1 g/dL 7.2   Total Bilirubin 0.3 - 1.2 mg/dL 1.8   Alkaline Phos 38 - 126 U/L 47   AST 15 - 41 U/L 16   ALT 0 - 44 U/L 10    Edinburgh Score:    03/02/2023    3:30 AM  Edinburgh Postnatal Depression Scale Screening Tool  I have been able to laugh and see  the funny side of things. 0  I have looked forward with enjoyment to things. 0  I have blamed myself unnecessarily when things went wrong. 1  I have been anxious or worried for no good reason. 2  I have felt scared or panicky for no good reason. 0  Things have been getting on top of me. 0  I have been so unhappy that I have had difficulty sleeping. 0  I have felt sad or miserable. 0  I have been so unhappy that I have been crying. 0  The thought of harming myself has occurred to me. 0  Edinburgh Postnatal Depression Scale Total 3      After visit meds:  Allergies as of 03/02/2023   No Known Allergies      Medication List     TAKE these medications    norethindrone 0.35 MG tablet Commonly known as: MICRONOR Take 1 tablet (0.35 mg total) by mouth daily. Start  taking on: April 01, 2023   prenatal vitamin w/FE, FA 29-1 MG Chew chewable tablet Chew 2 tablets by mouth daily at 12 noon.         Discharge home in stable condition Infant Feeding: Breast Infant Disposition:home with mother Discharge instruction: per After Visit Summary and Postpartum booklet. Activity: Advance as tolerated. Pelvic rest for 6 weeks.  Diet: routine diet Anticipated Birth Control: POPs vs IUD Postpartum Appointment:6 weeks Additional Postpartum F/U: Postpartum Depression checkup Future Appointments: Future Appointments  Date Time Provider Department Center  03/07/2023  8:55 AM Linzie Collin, MD [facility] None   Follow up Visit:  Follow-up Information     Mirna Mires, CNM. Schedule an appointment as soon as possible for a visit in 6 week(s).   Specialties: Obstetrics, Gynecology Why: make a virtual appointment for 2 weeks post delivery and another  in person appointment for 6 weeks post delivery Contact information: [address] [place] [address] [phone]                  03/02/2023 Tresea Mall, CNM

## 2023-03-01 NOTE — Progress Notes (Signed)
See paper fetal heart tracing chart. OBIX down in LDR4 Mackena Plummer B. Tyheem Boughner, RN BSN 03/01/2023 6:00 PM

## 2023-03-01 NOTE — Anesthesia Procedure Notes (Signed)
Epidural Patient location during procedure: OB Start time: 03/01/2023 4:23 PM End time: 03/01/2023 4:28 PM  Staffing Anesthesiologist: Stephanie Coup, MD Resident/CRNA: Stormy Fabian, CRNA Performed: resident/CRNA   Preanesthetic Checklist Completed: patient identified, IV checked, site marked, risks and benefits discussed, surgical consent, monitors and equipment checked, pre-op evaluation and timeout performed  Epidural Patient position: sitting Prep: ChloraPrep Patient monitoring: heart rate, continuous pulse ox and blood pressure Approach: midline Location: L3-L4 Injection technique: LOR saline  Needle:  Needle type: Tuohy  Needle gauge: 17 G Needle length: 9 cm and 9 Needle insertion depth: 8 cm Catheter type: closed end flexible Catheter size: 19 Gauge Catheter at skin depth: 13 cm Test dose: negative and 1.5% lidocaine with Epi 1:200 K  Assessment Sensory level: T10 Events: blood not aspirated, no cerebrospinal fluid, injection not painful, no injection resistance, no paresthesia and negative IV test  Additional Notes 1 attempt Pt. Evaluated and documentation done after procedure finished. Patient identified. Risks/Benefits/Options discussed with patient including but not limited to bleeding, infection, nerve damage, paralysis, failed block, incomplete pain control, headache, blood pressure changes, nausea, vomiting, reactions to medication both or allergic, itching and postpartum back pain. Confirmed with bedside nurse the patient's most recent platelet count. Confirmed with patient that they are not currently taking any anticoagulation, have any bleeding history or any family history of bleeding disorders. Patient expressed understanding and wished to proceed. All questions were answered. Sterile technique was used throughout the entire procedure. Please see nursing notes for vital signs. Test dose was given through epidural catheter and negative prior to continuing to  dose epidural or start infusion. Warning signs of high block given to the patient including shortness of breath, tingling/numbness in hands, complete motor block, or any concerning symptoms with instructions to call for help. Patient was given instructions on fall risk and not to get out of bed. All questions and concerns addressed with instructions to call with any issues or inadequate analgesia.    Patient tolerated the insertion well without immediate complications.Reason for block:procedure for pain

## 2023-03-01 NOTE — H&P (Signed)
Karen May is a 27 y.o. female presenting for labor management. She is term and in active labor upon her arrival.Triaged earlier today and was sent home as her contractions were far apart.  She denies any leaking of fluid. OB History     Gravida  4   Para  1   Term  1   Preterm  0   AB  2   Living  1      SAB  1   IAB  0   Ectopic  1   Multiple  0   Live Births  1          Past Medical History:  Diagnosis Date   Anemia    Anxiety    Eczema 2010   Environmental allergies    Past Surgical History:  Procedure Laterality Date   LAPAROSCOPIC UNILATERAL SALPINGECTOMY Right 11/14/2021   Procedure: LAPAROSCOPIC UNILATERAL SALPINGECTOMY;  Surgeon: Linzie Collin, MD;  Location: [facility] ORS;  Service: Gynecology;  Laterality: Right;   WISDOM TOOTH EXTRACTION     Family History: family history includes Breast cancer (age of onset: 29) in her cousin; Breast cancer (age of onset: 3) in her maternal aunt; Cancer in her cousin; Diabetes in her cousin and paternal grandmother; Healthy in her brother, brother, paternal grandfather, sister, and sister; Lung cancer (age of onset: 43) in her maternal grandmother; Post-traumatic stress disorder in her father and mother. Social History:  reports that she has never smoked. She has never used smokeless tobacco. She reports that she does not currently use alcohol. She reports that she does not currently use drugs.     Maternal Diabetes: No Genetic Screening: Normal Maternal Ultrasounds/Referrals: Normal Fetal Ultrasounds or other Referrals:  None Maternal Substance Abuse:  No Significant Maternal Medications:  None Significant Maternal Lab Results:  Group B Strep negative Number of Prenatal Visits:greater than 3 verified prenatal visits Maternal Vaccinations:RSV: Given during pregnancy >/=14 days ago Other Comments:  None  Review of Systems  Constitutional: Negative.   HENT: Negative.    Eyes: Negative.   Respiratory:  Negative.    Cardiovascular: Negative.   Gastrointestinal:  Positive for abdominal distention.       Gravid abdomen.  Endocrine: Negative.   Genitourinary: Negative.   Allergic/Immunologic: Negative.   Neurological: Negative.   Hematological: Negative.   Psychiatric/Behavioral:  The patient is nervous/anxious.    History   Last menstrual period 05/18/2022, currently breastfeeding. Maternal Exam:  Uterine Assessment: Contraction strength is moderate.  Contraction frequency is regular.  Abdomen: Estimated fetal weight is 7lbs.   Fetal presentation: vertex Introitus: Normal vulva. Normal vagina.  Pelvis: adequate for delivery.   Cervix: Cervix evaluated by digital exam.     Physical Exam Constitutional:      Appearance: Normal appearance.  HENT:     Head: Normocephalic and atraumatic.  Cardiovascular:     Rate and Rhythm: Normal rate and regular rhythm.     Pulses: Normal pulses.     Heart sounds: Normal heart sounds.  Pulmonary:     Effort: Pulmonary effort is normal.     Breath sounds: Normal breath sounds.  Abdominal:     Comments: Gravid abdomen  Genitourinary:    General: Normal vulva.  Musculoskeletal:        General: Normal range of motion.  Skin:    General: Skin is warm and dry.  Neurological:     General: No focal deficit present.     Mental Status: She is  alert and oriented to person, place, and time.  Psychiatric:        Mood and Affect: Mood normal.        Behavior: Behavior normal.     VE: 7-8 cms/90%/-1 station. IBOW  FHTS: 130 baseline, reassuring; patient is moving constantly- difficult to attain longer strip upon her arrival. Prenatal labs: ABO, Rh: O/Positive/-- (03/07 1552) Antibody: Negative (03/07 1552) Rubella: 2.30 (03/07 1552) RPR: Non Reactive (07/11 1555)  HBsAg: Negative (03/07 1552)  HIV: Non Reactive (07/11 1555)  GBS: Negative/-- (09/10 1615)   Assessment/Plan: 27 year old Gravida 4 Para 1 at 39 weeks 5 days gestation Active  labor Reactive , reassurring FHTS Well dilated- transitional labor GBS negative  Pla: quickly admitted Routine labs IV of LR  EFM Will rush to get epidural, but IV meds offered as labor is progressing rapidly. Epidural placement if time. Anticipate SVD.   Mirna Mires, CNM  03/01/2023 3:20 PM     Mirna Mires 03/01/2023, 3:07 PM

## 2023-03-01 NOTE — Anesthesia Preprocedure Evaluation (Signed)
Anesthesia Evaluation  Patient identified by MRN, date of birth, ID band Patient awake    Reviewed: Allergy & Precautions, H&P , NPO status , Patient's Chart, lab work & pertinent test results  Airway Mallampati: II   Neck ROM: full    Dental no notable dental hx.    Pulmonary neg pulmonary ROS   Pulmonary exam normal        Cardiovascular negative cardio ROS Normal cardiovascular exam     Neuro/Psych   Anxiety Depression    negative neurological ROS     GI/Hepatic negative GI ROS, Neg liver ROS,,,  Endo/Other  negative endocrine ROS    Renal/GU negative Renal ROS  negative genitourinary   Musculoskeletal   Abdominal   Peds  Hematology  (+) Blood dyscrasia, anemia   Anesthesia Other Findings   Reproductive/Obstetrics (+) Pregnancy                             Anesthesia Physical Anesthesia Plan  ASA: 1  Anesthesia Plan: Epidural   Post-op Pain Management:    Induction:   PONV Risk Score and Plan:   Airway Management Planned:   Additional Equipment:   Intra-op Plan:   Post-operative Plan:   Informed Consent: I have reviewed the patients History and Physical, chart, labs and discussed the procedure including the risks, benefits and alternatives for the proposed anesthesia with the patient or authorized representative who has indicated his/her understanding and acceptance.       Plan Discussed with: CRNA and Anesthesiologist  Anesthesia Plan Comments:        Anesthesia Quick Evaluation

## 2023-03-01 NOTE — Progress Notes (Signed)
Pt to LDR 4 from ED in active labor. CNM notified. Pt admitted.

## 2023-03-01 NOTE — Discharge Summary (Signed)
Discharge SummaryFinal Progress Note  Patient ID: Karen May MRN: [id number] DOB/AGE: [birth year] 27 y.o.  Admit date: 03/01/2023 Admitting provider: Mirna Mires, CNM Discharge date: 03/01/2023   Admission Diagnoses: irregular contractions IUP 39 weeks 5 days gestation  Discharge Diagnoses:  Principal Problem:   Indication for care in labor and delivery, antepartum  Latent phase labor Category 1 fetal heart tracing  History of Present Illness: The patient is a 27 y.o. female [phone] at [redacted]w[redacted]d who presents for a labor check. She is full term, and was seen in the office yesterday. Her cervix was swept. Today she reports regular contractions. She denies any vaginal bleeding or LOF. Her baby is moving well.Her pregnancy is noted for GBS negative, and a hx of anxiety/depression.  Past Medical History:  Diagnosis Date   Anemia    Anxiety    Eczema 2010   Environmental allergies     Past Surgical History:  Procedure Laterality Date   LAPAROSCOPIC UNILATERAL SALPINGECTOMY Right 11/14/2021   Procedure: LAPAROSCOPIC UNILATERAL SALPINGECTOMY;  Surgeon: Linzie Collin, MD;  Location: [facility] ORS;  Service: Gynecology;  Laterality: Right;   WISDOM TOOTH EXTRACTION      No current facility-administered medications on file prior to encounter.   Current Outpatient Medications on File Prior to Encounter  Medication Sig Dispense Refill   prenatal vitamin w/FE, FA (NATACHEW) 29-1 MG CHEW chewable tablet Chew 2 tablets by mouth daily at 12 noon.      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    Comment: undecided  Other Topics Concern   Not on file  Social History Narrative   ** Merged History Encounter **       Social Determinants of Health   Financial Resource Strain: Low Risk  (07/25/2022)   Overall Financial Resource Strain (CARDIA)    Difficulty of Paying Living Expenses: Not hard at all  Food Insecurity: No Food Insecurity (07/25/2022)   Hunger Vital Sign    Worried About Running Out of Food in the Last Year: Never true    Ran Out of Food in the Last Year: Never true  Transportation Needs: No Transportation Needs (07/25/2022)   PRAPARE - Administrator, Civil Service (Medical): No    Lack of Transportation (Non-Medical): No  Physical Activity: Inactive (07/25/2022)   Exercise Vital Sign    Days of Exercise per Week: 0 days    Minutes of Exercise per Session: 0 min  Stress: No Stress Concern Present (07/25/2022)   [employer] of Occupational Health - Occupational Stress Questionnaire    Feeling of Stress : Not at all  Social Connections: Moderately Integrated (07/25/2022)   Social Connection and Isolation Panel [NHANES]    Frequency of Communication with Friends and Family: More than three times a week    Frequency of Social Gatherings  with Friends and Family: More than three times a week    Attends Religious Services: More than 4 times per year    Active Member of Clubs or Organizations: No    Attends Banker Meetings: Never    Marital Status: Living with partner  Intimate Partner Violence: Not At Risk (07/25/2022)   Humiliation, Afraid, Rape, and Kick questionnaire    Fear of Current or Ex-Partner: No    Emotionally Abused: No    Physically Abused: No    Sexually Abused: No    Family History  Problem Relation Age of Onset   Post-traumatic  stress disorder Mother    Post-traumatic stress disorder Father    Healthy Sister    Healthy Sister    Healthy Brother    Healthy Brother    Lung cancer Maternal Grandmother 60   Diabetes Paternal Grandmother    Healthy Paternal Grandfather    Breast cancer Maternal Aunt 61       maternal great aunt   Cancer Cousin    Breast cancer Cousin 27   Diabetes Cousin      Review of Systems  Constitutional: Negative.   HENT: Negative.    Eyes: Negative.   Respiratory: Negative.    Cardiovascular: Negative.   Gastrointestinal: Negative.   Genitourinary: Negative.   Musculoskeletal: Negative.   Skin: Negative.   Neurological: Negative.   Endo/Heme/Allergies: Negative.   Psychiatric/Behavioral: Negative.       Physical Exam: BP (!) 101/59   Pulse 82   Temp 98.4 F (36.9 C) (Oral)   Resp 16   Ht 5\' 6"  (1.676 m)   Wt 86.2 kg   LMP 05/18/2022 (Approximate)   BMI 30.67 kg/m   Physical Exam Constitutional:      Appearance: Normal appearance.     Comments: She is smiling and talking through her contractions with occasional pausing during some of the Ucs.  Genitourinary:     Vulva and rectum normal.     Genitourinary Comments: SVE: 4cms/thick -50%/-3station, rather posterior  HENT:     Head: Normocephalic and atraumatic.  Cardiovascular:     Rate and Rhythm: Normal rate and regular rhythm.     Pulses: Normal pulses.     Heart sounds: Normal heart sounds.  Pulmonary:     Effort: Pulmonary effort is normal.     Breath sounds: Normal breath sounds.  Abdominal:     Comments: Gravid abdomen. Contractions palpated are mild.  Musculoskeletal:        General: Normal range of motion.     Cervical back: Normal range of motion and neck supple.  Neurological:     General: No focal deficit present.     Mental Status: She is alert and oriented to person, place, and time.  Skin:    General: Skin is warm and dry.  Psychiatric:        Mood and Affect: Mood normal.        Behavior:  Behavior normal.     Consults: None  Significant Findings/ Diagnostic Studies: none  Procedures: EFM NST Baseline FHR: 120 beats/min Variability: moderate Accelerations: present Decelerations: absent Tocometry: mild contractions q 8 minutes apart  Interpretation:  INDICATIONS: rule out uterine contractions RESULTS:  A NST procedure was performed with FHR monitoring and a normal baseline established, appropriate time of 20-40 minutes of evaluation, and accels >2 seen w 15x15 characteristics.  Results show a REACTIVE NST.    Hospital Course: The patient was admitted to Labor and  Delivery Triage for observation. She was placed on the fetal monitor and evaluated by the RN and then the CNM on call. Her tracing revealed mild , regular contractions approximately every 8 minutes. The patient was able to talk through these. After several hours of monitoring, no cervical change was noted and the pateint opted to go home and await active labor.  With normal vital signs, a d reactive tracing and careful instructions. She is discharge home and will return once the Ucs are closer together and more intense.  Discharge Condition: good  Disposition: Discharge disposition: 01-Home or Self Care       Diet: Regular diet  Discharge Activity: Activity as tolerated  Discharge Instructions     Fetal Kick Count:  Lie on our left side for one hour after a meal, and count the number of times your baby kicks.  If it is less than 5 times, get up, move around and drink some juice.  Repeat the test 30 minutes later.  If it is still less than 5 kicks in an hour, notify your doctor.   Complete by: As directed    LABOR:  When conractions begin, you should start to time them from the beginning of one contraction to the beginning  of the next.  When contractions are 5 - 10 minutes apart or less and have been regular for at least an hour, you should call your health care provider.   Complete by: As directed     Notify physician for bleeding from the vagina   Complete by: As directed    Notify physician for blurring of vision or spots before the eyes   Complete by: As directed    Notify physician for chills or fever   Complete by: As directed    Notify physician for fainting spells, "black outs" or loss of consciousness   Complete by: As directed    Notify physician for increase in vaginal discharge   Complete by: As directed    Notify physician for leaking of fluid   Complete by: As directed    Notify physician for pain or burning when urinating   Complete by: As directed    Notify physician for pelvic pressure (sudden increase)   Complete by: As directed    Notify physician for severe or continued nausea or vomiting   Complete by: As directed    Notify physician for sudden gushing of fluid from the vagina (with or without continued leaking)   Complete by: As directed    Notify physician for sudden, constant, or occasional abdominal pain   Complete by: As directed    Notify physician if baby moving less than usual   Complete by: As directed       Allergies as of 03/01/2023   No Known Allergies      Medication List     TAKE these medications    prenatal vitamin w/FE, FA 29-1 MG Chew chewable tablet Chew 2 tablets by mouth daily at 12 noon.         Total time spent taking care of this patient: 45 minutes  Signed: Mirna Mires, CNM  03/01/2023, 11:41 AM

## 2023-03-02 ENCOUNTER — Encounter: Payer: Self-pay | Admitting: Obstetrics and Gynecology

## 2023-03-02 LAB — CBC
HCT: 38.4 % (ref 36.0–46.0)
Hemoglobin: 13.3 g/dL (ref 12.0–15.0)
MCH: 29.8 pg (ref 26.0–34.0)
MCHC: 34.6 g/dL (ref 30.0–36.0)
MCV: 86.1 fL (ref 80.0–100.0)
Platelets: 202 10*3/uL (ref 150–400)
RBC: 4.46 MIL/uL (ref 3.87–5.11)
RDW: 16 % — ABNORMAL HIGH (ref 11.5–15.5)
WBC: 18.2 10*3/uL — ABNORMAL HIGH (ref 4.0–10.5)
nRBC: 0 % (ref 0.0–0.2)

## 2023-03-02 LAB — RPR: RPR Ser Ql: NONREACTIVE

## 2023-03-02 MED ORDER — NORETHINDRONE 0.35 MG PO TABS
1.0000 | ORAL_TABLET | Freq: Every day | ORAL | 11 refills | Status: DC
Start: 2023-04-01 — End: 2023-04-11

## 2023-03-02 NOTE — Discharge Instructions (Signed)
Call the office to schedule appointments for 2 week virtual appointment and 6 week in person visit.  If you have questions after discharge you should call the office or the on call provider.  If you have urgent concerns you should go to the nearest emergency department for evaluation.

## 2023-03-02 NOTE — Progress Notes (Signed)
Patient d/c home with infant. D/c instructions, Rx, and f/u appt given to and reviewed with pt. Pt verbalized understanding. Escorted out by staff.

## 2023-03-02 NOTE — Lactation Note (Signed)
This note was copied from a baby's chart. Lactation Consultation Note  Patient Name: Karen May [id number] Date: 03/02/2023 Age:27 hours Reason for consult: Initial assessment;Term   Maternal Data Does the patient have breastfeeding experience prior to this delivery?: Yes How long did the patient breastfeed?: 2.57yrs  Lactation to room with a P2 experienced breastfeeding patient and a 17hr old baby Karen.  This was a SVD.    Patient stated that her feeding goal is breastfeeding.  Patient has a Lanisnoh pump and a DEBP at home. Patient would love for lactation to observe a latch before she is discharged. Patient expressed infant has a strong suck and that her nipple is sore on the rt breast.    Feeding Mother's Current Feeding Choice: Breast Milk  Lactation did not observe a feeding.    Interventions Interventions: Breast feeding basics reviewed;Education;CDC Guidelines for Breast Pump Cleaning  LC provided education on the following;  milk production expectations, hunger cues, day 1/2 wet/dirty diapers, hand expression, cluster feeding, benefits of STS and arousing infant for a feeding.  Lactation informed patient of feeding infant at least 8 or more times w/in a 24hr period but not exceeding 3hrs. Patient verbalized understanding.    Discharge Discharge Education: Engorgement and breast care Pump: DEBP;Hands Free;Personal WIC Program: Yes  Education on engorgement prevention/treatment was discussed as well as breastmilk storage guidelines.  LC will provide a milk storage handout when she returns. LC also discussed how to contact [facility] outpatient lactation services for support. Patient verbalized understanding.    [facility] is a breast/chestfeeding support group that was also recommended to the patient.  Consult Status Consult Status: Complete    Yvette Rack Free 03/02/2023, 12:02 PM

## 2023-03-02 NOTE — Progress Notes (Addendum)
[facility]  Subjective:  Doing well postpartum day 1; she is tolerating regular diet, her pain is controlled with PO medication, she is ambulating and voiding without difficulty. She reports breastfeeding is going well.   Objective:  Vital signs in last 24 hours: Temp:  [98.1 F (36.7 C)-98.6 F (37 C)] 98.3 F (36.8 C) (10/04 0811) Pulse Rate:  [71-207] 71 (10/04 0811) Resp:  [17-18] 18 (10/04 0811) BP: (98-171)/(45-147) 109/73 (10/04 0811) SpO2:  [96 %-100 %] 98 % (10/04 0811) Weight:  [87.1 kg] 87.1 kg (10/03 1559)    General: NAD Pulmonary: no increased work of breathing Abdomen: non-distended, non-tender, fundus firm at level of umbilicus Extremities: no edema, no erythema, no tenderness  Results for orders placed or performed during the hospital encounter of 03/01/23 (from the past 72 hour(s))  Type and screen     Status: None   Collection Time: 03/01/23  3:41 PM  Result Value Ref Range   ABO/RH(D) O POS    Antibody Screen NEG    Sample Expiration      03/04/2023,2359 Performed at [facility], [address] [place], [place] [address]   CBC     Status: Abnormal   Collection Time: 03/01/23  3:41 PM  Result Value Ref Range   WBC 14.6 (H) 4.0 - 10.5 K/uL   RBC 5.27 (H) 3.87 - 5.11 MIL/uL   Hemoglobin 15.7 (H) 12.0 - 15.0 g/dL   HCT 19.1 47.8 - 29.5 %   MCV 86.1 80.0 - 100.0 fL   MCH 29.8 26.0 - 34.0 pg   MCHC 34.6 30.0 - 36.0 g/dL   RDW 62.1 (H) 30.8 - 65.7 %   Platelets 223 150 - 400 K/uL   nRBC 0.0 0.0 - 0.2 %    Comment: Performed at [facility], [address] [place], [place] [address]  RPR     Status: None   Collection Time: 03/01/23  3:41 PM  Result Value Ref Range   RPR Ser Ql NON REACTIVE NON REACTIVE    Comment: Performed at [facility], [address] [place], [place] [address]  CBC     Status: Abnormal   Collection Time: 03/02/23  5:26 AM  Result Value Ref Range   WBC 18.2 (H) 4.0 - 10.5 K/uL   RBC 4.46 3.87 -  5.11 MIL/uL   Hemoglobin 13.3 12.0 - 15.0 g/dL   HCT 41.3 24.4 - 01.0 %   MCV 86.1 80.0 - 100.0 fL   MCH 29.8 26.0 - 34.0 pg   MCHC 34.6 30.0 - 36.0 g/dL   RDW 27.2 (H) 53.6 - 64.4 %   Platelets 202 150 - 400 K/uL   nRBC 0.0 0.0 - 0.2 %    Comment: Performed at [facility], [address] [place], [place] [address]    Assessment:   27 y.o. [phone] postpartum day # 1, lactating  Plan:    1) Acute blood loss at delivery, not anemic - hemodynamically stable and asymptomatic Blood loss is not clinically significant for this admission - po ferrous sulfate  2) Blood Type --/--/O POS (10/03 1541) / Rubella 2.30 (03/07 1552) / Varicella Immune  3) TDAP status: up to date  4) Feeding plan: breast  5)  Education given regarding options for contraception, as well as compatibility with breast feeding if applicable.  Patient plans on  pills vs IUD  for contraception.  6) Disposition: continue current care with possibility of discharge later  today pending newborn progress/testing   Tresea Mall, CNM [facility] 03/02/2023 10:22 AM

## 2023-03-02 NOTE — Anesthesia Postprocedure Evaluation (Signed)
Anesthesia Post Note  Patient: Karen May  Procedure(s) Performed: AN AD HOC LABOR EPIDURAL  Patient location during evaluation: Mother Baby Anesthesia Type: Epidural Level of consciousness: awake and alert and oriented Pain management: pain level controlled Vital Signs Assessment: post-procedure vital signs reviewed and stable Respiratory status: respiratory function stable Cardiovascular status: stable Postop Assessment: no headache, no backache, patient able to bend at knees, no apparent nausea or vomiting, adequate PO intake and able to ambulate Anesthetic complications: no   No notable events documented.   Last Vitals:  Vitals:   03/02/23 0335 03/02/23 0811  BP: 98/62 109/73  Pulse: 74 71  Resp: 17 18  Temp: 36.7 C 36.8 C  SpO2: 98% 98%    Last Pain:  Vitals:   03/02/23 0811  TempSrc: Oral  PainSc:                  Zachary George

## 2023-03-07 ENCOUNTER — Encounter: Payer: Medicaid Other | Admitting: Obstetrics and Gynecology

## 2023-03-09 ENCOUNTER — Encounter: Payer: Self-pay | Admitting: Obstetrics

## 2023-03-20 ENCOUNTER — Telehealth (INDEPENDENT_AMBULATORY_CARE_PROVIDER_SITE_OTHER): Payer: Medicaid Other | Admitting: Obstetrics

## 2023-03-20 NOTE — Progress Notes (Addendum)
Postpartum Visit  Chief Complaint: No chief complaint on file.   History of Present Illness: Patient is a 27 y.o. [id number] presents for a 2 week virtual postpartum visit.She is identified by name and the people on the call are [name] CNM and Karen May patient. The patient was at home and [name] was in the [facility] office. Karen May sounds happy and reports she is doing well. This visit is a telephone visit.She is nursing her baby, and reports that this is going well. Her bleeding is very light; she denies any dysuria, and her Inocente Salles score is 2. She delivered intact.  Date of delivery: 03/01/2023 Type of delivery: Vaginal delivery - Vacuum or forceps assisted  no Episiotomy No.  Laceration: no  Pregnancy or labor problems:  no Any problems since the delivery:  no  Newborn Details:  SINGLETON :  1. Baby's name: boy. Birth weight: 3350gms Maternal Details:  Breast Feeding:  yes Post partum depression/anxiety noted:  no Edinburgh Post-Partum Depression Score:  2  Date of last PAP: 2022  normal   Past Medical History:  Diagnosis Date   Anemia    Anxiety    Eczema 2010   Environmental allergies     Past Surgical History:  Procedure Laterality Date   LAPAROSCOPIC UNILATERAL SALPINGECTOMY Right 11/14/2021   Procedure: LAPAROSCOPIC UNILATERAL SALPINGECTOMY;  Surgeon: Linzie Collin, MD;  Location: [facility] ORS;  Service: Gynecology;  Laterality: Right;   WISDOM TOOTH EXTRACTION      Prior to Admission medications   Medication Sig Start Date End Date Taking? Authorizing Provider  norethindrone (MICRONOR) 0.35 MG tablet Take 1 tablet (0.35 mg total) by mouth daily. 04/01/23   Tresea Mall, CNM  prenatal vitamin w/FE, FA (NATACHEW) 29-1 MG CHEW chewable tablet Chew 2 tablets by mouth daily at 12 noon.    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    Comment: undecided  Other Topics Concern   Not on file  Social History Narrative   ** Merged History Encounter **       Social Determinants of Health   Financial Resource Strain: Low Risk  (07/25/2022)   Overall Financial Resource Strain (CARDIA)    Difficulty of Paying Living Expenses: Not hard at all  Food Insecurity: No Food Insecurity (03/01/2023)   Hunger Vital Sign    Worried About Running Out of Food in the Last Year: Never true    Ran Out of Food in the Last Year: Never true  Transportation Needs: No Transportation Needs (03/01/2023)   PRAPARE - Administrator, Civil Service (Medical): No    Lack of Transportation (Non-Medical): No  Physical Activity: Inactive (07/25/2022)   Exercise Vital Sign    Days of Exercise per Week: 0 days    Minutes of Exercise  per Session: 0 min  Stress: No Stress Concern Present (07/25/2022)   [employer] of Occupational Health - Occupational Stress Questionnaire    Feeling of Stress : Not at all  Social Connections: Moderately Integrated (07/25/2022)   Social Connection and Isolation Panel [NHANES]    Frequency of Communication with Friends and Family: More than three times a week    Frequency of Social Gatherings with Friends and Family: More than three times a week    Attends Religious Services: More than 4 times per year    Active Member of [employer] or Organizations: No    Attends Banker Meetings: Never    Marital Status: Living with  partner  Intimate Partner Violence: Not At Risk (03/01/2023)   Humiliation, Afraid, Rape, and Kick questionnaire    Fear of Current or Ex-Partner: No    Emotionally Abused: No    Physically Abused: No    Sexually Abused: No    Family History  Problem Relation Age of Onset   Post-traumatic stress disorder Mother    Post-traumatic stress disorder Father    Healthy Sister    Healthy Sister    Healthy Brother    Healthy Brother    Lung cancer Maternal Grandmother 60   Diabetes Paternal Grandmother    Healthy Paternal Grandfather    Breast cancer Maternal Aunt 72       maternal great aunt   Cancer Cousin    Breast cancer Cousin 15   Diabetes Cousin            Assessment: 60 y.o. [id number] presenting for 2 week virtual postpartum visit Doing well. Breast feeding without problems  Plan: Problem List Items Addressed This Visit   None     Patient underwent screening for postpartum depression with no concerns noted.  Follow up 3 weeks for her 6 week PP visit. She may opt for OCPs or , after discussing contraception, is now thinking she may trial an IUD.  Karen May, CNM  03/20/2023 4:12 PM   03/20/2023 4:12 PM

## 2023-03-22 NOTE — Plan of Care (Signed)
[facility] Tonsillectomy/Adenoidectomy, Postoperative PEDS care plan entered in error.

## 2023-04-11 ENCOUNTER — Encounter: Payer: Self-pay | Admitting: Obstetrics

## 2023-04-11 ENCOUNTER — Ambulatory Visit (INDEPENDENT_AMBULATORY_CARE_PROVIDER_SITE_OTHER): Payer: Medicaid Other | Admitting: Obstetrics

## 2023-04-11 VITALS — BP 113/72 | HR 87 | Ht 66.0 in | Wt 162.0 lb

## 2023-04-11 DIAGNOSIS — Z3041 Encounter for surveillance of contraceptive pills: Secondary | ICD-10-CM

## 2023-04-11 DIAGNOSIS — Z30011 Encounter for initial prescription of contraceptive pills: Secondary | ICD-10-CM

## 2023-04-11 MED ORDER — NORETHINDRONE 0.35 MG PO TABS: 1.0000 | ORAL_TABLET | Freq: Every day | ORAL | 11 refills | Status: AC

## 2023-04-11 NOTE — Progress Notes (Signed)
Postpartum Visit  Chief Complaint:  Chief Complaint  Patient presents with   Postpartum Care    6 week   Contraception    BC pills    History of Present Illness: Patient is a 27 y.o. [id number] presents for postpartum visit.  Date of delivery: 03/01/2023 Type of delivery: Vaginal delivery - Vacuum or forceps assisted  no Episiotomy No.  Laceration: no  Pregnancy or labor problems:  no Any problems since the delivery:  no  Newborn Details:  SINGLETON :  1. Baby's name: female. Birth weight: 3350gms Maternal Details:  Breast Feeding:  yes Post partum depression/anxiety noted:  no Edinburgh Post-Partum Depression Score:  0  Date of last PAP: 03/19/2021  normal   Past Medical History:  Diagnosis Date   Anemia    Anxiety    Eczema 2010   Environmental allergies     Past Surgical History:  Procedure Laterality Date   LAPAROSCOPIC UNILATERAL SALPINGECTOMY Right 11/14/2021   Procedure: LAPAROSCOPIC UNILATERAL SALPINGECTOMY;  Surgeon: Linzie Collin, MD;  Location: [facility] ORS;  Service: Gynecology;  Laterality: Right;   WISDOM TOOTH EXTRACTION      Prior to Admission medications   Medication Sig Start Date End Date Taking? Authorizing Provider  prenatal vitamin w/FE, FA (NATACHEW) 29-1 MG CHEW chewable tablet Chew 2 tablets by mouth daily at 12 noon.   Yes [provider]  norethindrone (MICRONOR) 0.35 MG tablet Take 1 tablet (0.35 mg total) by mouth daily. 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  Tobacco Use   Smoking status: Never   Smokeless tobacco: Never  Vaping Use   Vaping status: Never Used  Substance and Sexual  Activity   Alcohol use: Not Currently    Comment: occ   Drug use: Not Currently   Sexual activity: Not Currently    Partners: Male    Birth control/protection: None  Other Topics Concern   Not on file  Social History Narrative   ** Merged History Encounter **       Social Determinants of Health   Financial Resource Strain: Low Risk  (07/25/2022)   Overall Financial Resource Strain (CARDIA)    Difficulty of Paying Living Expenses: Not hard at all  Food Insecurity: No Food Insecurity (03/01/2023)   Hunger Vital Sign    Worried About Running Out of Food in the Last Year: Never true    Ran Out of Food in the Last Year: Never true  Transportation Needs: No Transportation Needs (03/01/2023)   PRAPARE - Administrator, Civil Service (Medical): No    Lack of Transportation (Non-Medical): No  Physical Activity: Inactive (07/25/2022)   Exercise Vital Sign    Days of Exercise per Week: 0 days    Minutes of Exercise per Session: 0 min  Stress: No Stress Concern Present (07/25/2022)   [employer] of Occupational Health - Occupational Stress Questionnaire    Feeling of Stress : Not at all  Social Connections: Moderately Integrated (07/25/2022)   Social Connection and Isolation Panel [NHANES]    Frequency of Communication with Friends and Family: More than three times  a week    Frequency of Social Gatherings with Friends and Family: More than three times a week    Attends Religious Services: More than 4 times per year    Active Member of [employer] or Organizations: No    Attends Banker Meetings: Never    Marital Status: Living with partner  Intimate Partner Violence: Not At Risk (03/01/2023)   Humiliation, Afraid, Rape, and Kick questionnaire    Fear of Current or Ex-Partner: No    Emotionally Abused: No    Physically Abused: No    Sexually Abused: No    Family History  Problem Relation Age of Onset   Post-traumatic stress disorder Mother    Post-traumatic  stress disorder Father    Healthy Sister    Healthy Sister    Healthy Brother    Healthy Brother    Lung cancer Maternal Grandmother 60   Diabetes Paternal Grandmother    Healthy Paternal Grandfather    Breast cancer Maternal Aunt 34       maternal great aunt   Cancer Cousin    Breast cancer Cousin 27   Diabetes Cousin     ROS   Physical Exam BP 113/72   Pulse 87   Ht 5\' 6"  (1.676 m)   Wt 162 lb (73.5 kg)   Breastfeeding Yes   BMI 26.15 kg/m   OBGyn Exam   Female Chaperone present during breast and/or pelvic exam.  Assessment: 27 y.o. [id number] presenting for 6 week postpartum visit  Plan: Problem List Items Addressed This Visit       Other   Postpartum care following vaginal delivery - Primary   Other Visit Diagnoses     Encounter for surveillance of contraceptive pills            1) Contraception Education given regarding options for contraception, including oral contraceptives.  2)  Pap - ASCCP guidelines and rational discussed.  Patient opts for annual screening interval  3) Patient underwent screening for postpartum depression with no concerns noted.  4) Follow up 1 year for routine annual exam  Mirna Mires, CNM  04/11/2023 5:15 PM   04/11/2023 5:12 PM

## 2024-04-27 ENCOUNTER — Ambulatory Visit: Admission: EM | Admit: 2024-04-27 | Discharge: 2024-04-27 | Disposition: A | Discharge status: Home / Self Care

## 2024-04-27 ENCOUNTER — Other Ambulatory Visit: Payer: Self-pay

## 2024-04-27 DIAGNOSIS — S134XXA Sprain of ligaments of cervical spine, initial encounter: Secondary | ICD-10-CM
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
## 2024-04-27 NOTE — Discharge Instructions (Signed)
-  Heating pad, rest, stretching, massage, hot shower -You can take tylenol  for discomfort -Follow-up if new symptoms like severe neck pain, new headaches, new dizziness, new pain or weakness in arms.
